# Patient Record
Sex: Female | Born: 1950 | Race: Black or African American | Hispanic: No | Marital: Married | Smoking: Never smoker
Health system: Southern US, Community
[De-identification: ages and names within clinical notes are randomized; demographics above are authoritative.]

## PROBLEM LIST (undated history)

## (undated) DIAGNOSIS — K219 Gastro-esophageal reflux disease without esophagitis: Secondary | ICD-10-CM

## (undated) DIAGNOSIS — T7840XA Allergy, unspecified, initial encounter: Secondary | ICD-10-CM

## (undated) DIAGNOSIS — E785 Hyperlipidemia, unspecified: Secondary | ICD-10-CM

## (undated) DIAGNOSIS — F419 Anxiety disorder, unspecified: Secondary | ICD-10-CM

## (undated) HISTORY — PX: WISDOM TOOTH EXTRACTION: SHX21

## (undated) HISTORY — PX: TONSILLECTOMY: SUR1361

## (undated) HISTORY — PX: COLONOSCOPY: SHX174

## (undated) HISTORY — DX: Hyperlipidemia, unspecified: E78.5

## (undated) HISTORY — PX: DILATION AND CURETTAGE OF UTERUS: SHX78

## (undated) HISTORY — DX: Allergy, unspecified, initial encounter: T78.40XA

## (undated) HISTORY — DX: Anxiety disorder, unspecified: F41.9

## (undated) HISTORY — DX: Gastro-esophageal reflux disease without esophagitis: K21.9

---

## 1999-04-28 ENCOUNTER — Other Ambulatory Visit: Admission: RE | Admit: 1999-04-28 | Discharge: 1999-04-28 | Payer: Self-pay | Admitting: Internal Medicine

## 1999-08-14 ENCOUNTER — Encounter: Admission: RE | Admit: 1999-08-14 | Discharge: 1999-08-14 | Payer: Self-pay | Admitting: Internal Medicine

## 1999-08-14 ENCOUNTER — Encounter: Payer: Self-pay | Admitting: Internal Medicine

## 2000-04-27 ENCOUNTER — Other Ambulatory Visit: Admission: RE | Admit: 2000-04-27 | Discharge: 2000-04-27 | Payer: Self-pay | Admitting: Internal Medicine

## 2000-05-25 ENCOUNTER — Encounter: Admission: RE | Admit: 2000-05-25 | Discharge: 2000-05-25 | Payer: Self-pay | Admitting: Internal Medicine

## 2000-05-25 ENCOUNTER — Encounter: Payer: Self-pay | Admitting: Internal Medicine

## 2001-04-27 ENCOUNTER — Other Ambulatory Visit: Admission: RE | Admit: 2001-04-27 | Discharge: 2001-04-27 | Payer: Self-pay | Admitting: Internal Medicine

## 2001-06-07 ENCOUNTER — Encounter: Admission: RE | Admit: 2001-06-07 | Discharge: 2001-06-07 | Payer: Self-pay | Admitting: Internal Medicine

## 2001-06-07 ENCOUNTER — Encounter: Payer: Self-pay | Admitting: Internal Medicine

## 2002-04-04 ENCOUNTER — Encounter: Admission: RE | Admit: 2002-04-04 | Discharge: 2002-07-03 | Payer: Self-pay | Admitting: Family Medicine

## 2002-06-12 ENCOUNTER — Encounter: Admission: RE | Admit: 2002-06-12 | Discharge: 2002-06-12 | Payer: Self-pay | Admitting: Internal Medicine

## 2002-06-12 ENCOUNTER — Encounter: Payer: Self-pay | Admitting: Internal Medicine

## 2003-06-22 ENCOUNTER — Encounter: Payer: Self-pay | Admitting: Internal Medicine

## 2003-06-22 ENCOUNTER — Encounter: Admission: RE | Admit: 2003-06-22 | Discharge: 2003-06-22 | Payer: Self-pay | Admitting: Internal Medicine

## 2004-05-21 ENCOUNTER — Other Ambulatory Visit: Admission: RE | Admit: 2004-05-21 | Discharge: 2004-05-21 | Payer: Self-pay | Admitting: Internal Medicine

## 2004-08-15 ENCOUNTER — Encounter: Admission: RE | Admit: 2004-08-15 | Discharge: 2004-08-15 | Payer: Self-pay | Admitting: Internal Medicine

## 2005-09-22 ENCOUNTER — Encounter: Admission: RE | Admit: 2005-09-22 | Discharge: 2005-09-22 | Payer: Self-pay | Admitting: Internal Medicine

## 2006-09-29 ENCOUNTER — Encounter: Admission: RE | Admit: 2006-09-29 | Discharge: 2006-09-29 | Payer: Self-pay | Admitting: Internal Medicine

## 2007-05-27 ENCOUNTER — Other Ambulatory Visit: Admission: RE | Admit: 2007-05-27 | Discharge: 2007-05-27 | Payer: Self-pay | Admitting: Internal Medicine

## 2007-10-26 ENCOUNTER — Encounter: Admission: RE | Admit: 2007-10-26 | Discharge: 2007-10-26 | Payer: Self-pay | Admitting: Internal Medicine

## 2008-06-15 ENCOUNTER — Ambulatory Visit: Payer: Self-pay | Admitting: Internal Medicine

## 2008-06-28 ENCOUNTER — Ambulatory Visit: Payer: Self-pay | Admitting: Internal Medicine

## 2008-09-28 ENCOUNTER — Emergency Department (HOSPITAL_COMMUNITY): Admission: EM | Admit: 2008-09-28 | Discharge: 2008-09-28 | Payer: Self-pay | Admitting: Family Medicine

## 2008-10-26 ENCOUNTER — Encounter: Admission: RE | Admit: 2008-10-26 | Discharge: 2008-10-26 | Payer: Self-pay | Admitting: Internal Medicine

## 2009-11-08 ENCOUNTER — Encounter: Admission: RE | Admit: 2009-11-08 | Discharge: 2009-11-08 | Payer: Self-pay | Admitting: Internal Medicine

## 2010-05-06 ENCOUNTER — Other Ambulatory Visit: Admission: RE | Admit: 2010-05-06 | Discharge: 2010-05-06 | Payer: Self-pay | Admitting: Internal Medicine

## 2010-08-05 ENCOUNTER — Encounter: Admission: RE | Admit: 2010-08-05 | Discharge: 2010-08-05 | Payer: Self-pay | Admitting: Internal Medicine

## 2010-10-27 ENCOUNTER — Other Ambulatory Visit: Payer: Self-pay | Admitting: Internal Medicine

## 2010-10-27 DIAGNOSIS — Z1239 Encounter for other screening for malignant neoplasm of breast: Secondary | ICD-10-CM

## 2010-10-27 DIAGNOSIS — Z1231 Encounter for screening mammogram for malignant neoplasm of breast: Secondary | ICD-10-CM

## 2010-11-10 ENCOUNTER — Ambulatory Visit
Admission: RE | Admit: 2010-11-10 | Discharge: 2010-11-10 | Disposition: A | Discharge status: Home / Self Care | Payer: Managed Care, Other (non HMO) | Source: Ambulatory Visit | Attending: Internal Medicine | Admitting: Internal Medicine

## 2010-11-10 DIAGNOSIS — Z1231 Encounter for screening mammogram for malignant neoplasm of breast: Secondary | ICD-10-CM

## 2011-01-12 LAB — URINE CULTURE: Colony Count: 85000

## 2011-01-12 LAB — POCT URINALYSIS DIP (DEVICE)
Bilirubin Urine: NEGATIVE
Ketones, ur: NEGATIVE mg/dL
Protein, ur: 30 mg/dL — AB
Urobilinogen, UA: 0.2 mg/dL (ref 0.0–1.0)
pH: 5 (ref 5.0–8.0)

## 2011-11-02 ENCOUNTER — Other Ambulatory Visit: Payer: Self-pay | Admitting: Internal Medicine

## 2011-11-02 DIAGNOSIS — Z1231 Encounter for screening mammogram for malignant neoplasm of breast: Secondary | ICD-10-CM

## 2011-11-17 ENCOUNTER — Ambulatory Visit
Admission: RE | Admit: 2011-11-17 | Discharge: 2011-11-17 | Disposition: A | Discharge status: Home / Self Care | Payer: BC Managed Care – PPO | Source: Ambulatory Visit | Attending: Internal Medicine | Admitting: Internal Medicine

## 2011-11-17 DIAGNOSIS — Z1231 Encounter for screening mammogram for malignant neoplasm of breast: Secondary | ICD-10-CM

## 2012-11-04 ENCOUNTER — Other Ambulatory Visit: Payer: Self-pay | Admitting: Internal Medicine

## 2012-11-04 DIAGNOSIS — Z1231 Encounter for screening mammogram for malignant neoplasm of breast: Secondary | ICD-10-CM

## 2012-12-01 ENCOUNTER — Ambulatory Visit
Admission: RE | Admit: 2012-12-01 | Discharge: 2012-12-01 | Disposition: A | Discharge status: Home / Self Care | Payer: BC Managed Care – PPO | Source: Ambulatory Visit | Attending: Internal Medicine | Admitting: Internal Medicine

## 2012-12-01 DIAGNOSIS — Z1231 Encounter for screening mammogram for malignant neoplasm of breast: Secondary | ICD-10-CM

## 2013-05-09 ENCOUNTER — Other Ambulatory Visit (HOSPITAL_COMMUNITY)
Admission: RE | Admit: 2013-05-09 | Discharge: 2013-05-09 | Disposition: A | Discharge status: Home / Self Care | Payer: BC Managed Care – PPO | Source: Ambulatory Visit | Attending: Internal Medicine | Admitting: Internal Medicine

## 2013-05-09 DIAGNOSIS — Z01419 Encounter for gynecological examination (general) (routine) without abnormal findings: Secondary | ICD-10-CM | POA: Insufficient documentation

## 2013-09-12 ENCOUNTER — Encounter: Payer: Self-pay | Admitting: Internal Medicine

## 2013-09-12 ENCOUNTER — Telehealth: Payer: Self-pay

## 2013-09-12 NOTE — Telephone Encounter (Signed)
Message copied by Annett Fabian on Tue Sep 12, 2013 11:15 AM ------      Message from: Iva Boop      Created: Tue Sep 12, 2013 10:54 AM      Regarding: age ogf mom when dx CRCA       Please call - ask when mom had CRCA - could change whether she needs one now ------

## 2013-09-12 NOTE — Telephone Encounter (Signed)
Next colonoscopy due 06/2018 then. Routine risk.

## 2013-09-12 NOTE — Telephone Encounter (Signed)
Spoke with the patient.  Her mother was in her 59's when she was diagnosed.

## 2013-09-19 ENCOUNTER — Encounter: Payer: Self-pay | Admitting: Internal Medicine

## 2013-11-21 ENCOUNTER — Other Ambulatory Visit: Payer: Self-pay

## 2013-11-21 DIAGNOSIS — Z1231 Encounter for screening mammogram for malignant neoplasm of breast: Secondary | ICD-10-CM

## 2013-12-13 ENCOUNTER — Ambulatory Visit
Admission: RE | Admit: 2013-12-13 | Discharge: 2013-12-13 | Disposition: A | Discharge status: Home / Self Care | Payer: BC Managed Care – PPO | Source: Ambulatory Visit

## 2013-12-13 DIAGNOSIS — Z1231 Encounter for screening mammogram for malignant neoplasm of breast: Secondary | ICD-10-CM

## 2014-05-09 ENCOUNTER — Ambulatory Visit
Admission: RE | Admit: 2014-05-09 | Discharge: 2014-05-09 | Disposition: A | Discharge status: Home / Self Care | Payer: BC Managed Care – PPO | Source: Ambulatory Visit | Attending: Internal Medicine | Admitting: Internal Medicine

## 2014-05-09 ENCOUNTER — Other Ambulatory Visit: Payer: Self-pay | Admitting: Internal Medicine

## 2014-05-09 DIAGNOSIS — R52 Pain, unspecified: Secondary | ICD-10-CM

## 2014-11-26 ENCOUNTER — Other Ambulatory Visit: Payer: Self-pay

## 2014-11-26 DIAGNOSIS — Z1231 Encounter for screening mammogram for malignant neoplasm of breast: Secondary | ICD-10-CM

## 2014-12-17 ENCOUNTER — Ambulatory Visit
Admission: RE | Admit: 2014-12-17 | Discharge: 2014-12-17 | Disposition: A | Discharge status: Home / Self Care | Payer: BLUE CROSS/BLUE SHIELD | Source: Ambulatory Visit

## 2014-12-17 ENCOUNTER — Encounter (INDEPENDENT_AMBULATORY_CARE_PROVIDER_SITE_OTHER): Payer: Self-pay

## 2014-12-17 DIAGNOSIS — Z1231 Encounter for screening mammogram for malignant neoplasm of breast: Secondary | ICD-10-CM

## 2015-04-04 ENCOUNTER — Encounter: Payer: Self-pay | Admitting: Internal Medicine

## 2015-08-26 DIAGNOSIS — L669 Cicatricial alopecia, unspecified: Secondary | ICD-10-CM | POA: Insufficient documentation

## 2015-08-26 DIAGNOSIS — L6681 Central centrifugal cicatricial alopecia: Secondary | ICD-10-CM | POA: Insufficient documentation

## 2015-11-27 ENCOUNTER — Other Ambulatory Visit: Payer: Self-pay

## 2015-11-27 DIAGNOSIS — Z1231 Encounter for screening mammogram for malignant neoplasm of breast: Secondary | ICD-10-CM

## 2015-12-18 ENCOUNTER — Ambulatory Visit
Admission: RE | Admit: 2015-12-18 | Discharge: 2015-12-18 | Disposition: A | Discharge status: Home / Self Care | Payer: BLUE CROSS/BLUE SHIELD | Source: Ambulatory Visit

## 2015-12-18 DIAGNOSIS — Z1231 Encounter for screening mammogram for malignant neoplasm of breast: Secondary | ICD-10-CM

## 2016-05-27 ENCOUNTER — Other Ambulatory Visit (HOSPITAL_COMMUNITY)
Admission: RE | Admit: 2016-05-27 | Discharge: 2016-05-27 | Disposition: A | Discharge status: Home / Self Care | Payer: BLUE CROSS/BLUE SHIELD | Source: Ambulatory Visit | Attending: Internal Medicine | Admitting: Internal Medicine

## 2016-05-27 DIAGNOSIS — Z01419 Encounter for gynecological examination (general) (routine) without abnormal findings: Secondary | ICD-10-CM | POA: Diagnosis present

## 2016-11-25 ENCOUNTER — Other Ambulatory Visit: Payer: Self-pay | Admitting: Internal Medicine

## 2016-11-25 DIAGNOSIS — Z1231 Encounter for screening mammogram for malignant neoplasm of breast: Secondary | ICD-10-CM

## 2017-01-04 ENCOUNTER — Ambulatory Visit
Admission: RE | Admit: 2017-01-04 | Discharge: 2017-01-04 | Disposition: A | Discharge status: Home / Self Care | Payer: Medicare Other | Source: Ambulatory Visit | Attending: Internal Medicine | Admitting: Internal Medicine

## 2017-01-04 DIAGNOSIS — Z1231 Encounter for screening mammogram for malignant neoplasm of breast: Secondary | ICD-10-CM

## 2017-12-10 ENCOUNTER — Other Ambulatory Visit: Payer: Self-pay | Admitting: Internal Medicine

## 2017-12-10 DIAGNOSIS — Z1231 Encounter for screening mammogram for malignant neoplasm of breast: Secondary | ICD-10-CM

## 2018-01-05 ENCOUNTER — Ambulatory Visit: Payer: Medicare Other

## 2018-01-05 ENCOUNTER — Ambulatory Visit
Admission: RE | Admit: 2018-01-05 | Discharge: 2018-01-05 | Disposition: A | Discharge status: Home / Self Care | Payer: Medicare Other | Source: Ambulatory Visit | Attending: Internal Medicine | Admitting: Internal Medicine

## 2018-01-05 DIAGNOSIS — Z1231 Encounter for screening mammogram for malignant neoplasm of breast: Secondary | ICD-10-CM

## 2018-07-29 ENCOUNTER — Encounter: Payer: Self-pay | Admitting: Internal Medicine

## 2018-08-22 ENCOUNTER — Encounter: Payer: Self-pay | Admitting: Podiatry

## 2018-08-22 ENCOUNTER — Ambulatory Visit: Payer: Medicare Other | Admitting: Podiatry

## 2018-08-22 VITALS — BP 137/69 | HR 72 | Resp 16

## 2018-08-22 DIAGNOSIS — Q828 Other specified congenital malformations of skin: Secondary | ICD-10-CM

## 2018-08-22 NOTE — Progress Notes (Signed)
   Subjective:    Patient ID: Karla Moore, female    DOB: 1950-10-17, 67 y.o.   MRN: [id number]  HPI    Review of Systems  All other systems reviewed and are negative.      Objective:   Physical Exam        Assessment & Plan:

## 2018-08-23 NOTE — Progress Notes (Signed)
Subjective:   Patient ID: Karla Moore, female   DOB: 67 y.o.   MRN: [id number]   HPI Patient presents with lesions on the bottom of both feet that is been sore and is been going on for a while and had it treated a number of years ago.  Patient tries to treated self but at times they become too tender and she cannot do it.  Patient does not smoke and likes to be active   ROS      Objective:  Physical Exam  Constitutional: She appears well-developed and well-nourished.  Cardiovascular: Intact distal pulses.  Pulmonary/Chest: Effort normal.  Musculoskeletal: Normal range of motion.  Neurological: She is alert.  Skin: Skin is warm.  Nursing note and vitals reviewed.   Neurovascular status intact muscle strength was adequate patient found to have keratotic lesion sub-fifth metatarsal right sub-second metatarsal left with a small plugged gland that is very painful when palpated.  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  Educated her on deformity

## 2018-08-30 ENCOUNTER — Encounter: Payer: Self-pay | Admitting: Internal Medicine

## 2018-09-14 ENCOUNTER — Encounter: Payer: Medicare Other | Admitting: Internal Medicine

## 2018-09-29 ENCOUNTER — Encounter: Payer: Self-pay | Admitting: Internal Medicine

## 2018-09-29 ENCOUNTER — Ambulatory Visit (AMBULATORY_SURGERY_CENTER): Payer: Self-pay | Admitting: *Deleted

## 2018-09-29 VITALS — Ht 67.0 in | Wt 180.0 lb

## 2018-09-29 DIAGNOSIS — Z1211 Encounter for screening for malignant neoplasm of colon: Secondary | ICD-10-CM

## 2018-09-29 NOTE — Progress Notes (Signed)
No egg or soy allergy known to patient  No issues with past sedation with any surgeries  or procedures, no intubation problems  No diet pills per patient No home 02 use per patient  No blood thinners per patient  Pt denies issues with constipation  No A fib or A flutter  EMMI video sent to pt's e mail -- pt declined  husband in [place] today with pt

## 2018-10-13 ENCOUNTER — Ambulatory Visit: Payer: Medicare Other | Admitting: Internal Medicine

## 2018-10-13 ENCOUNTER — Encounter: Payer: Self-pay | Admitting: Internal Medicine

## 2018-10-13 VITALS — BP 147/75 | HR 81 | Temp 98.0°F | Ht 67.0 in | Wt 180.0 lb

## 2018-10-13 MED ORDER — SODIUM CHLORIDE 0.9 % IV SOLN: 500.0000 mL | Freq: Once | INTRAVENOUS | Status: DC

## 2018-10-13 NOTE — Progress Notes (Signed)
Unable to obtain IV access. Patient offered unsedated colonoscopy but declined. Will need to arrange for IV placement by interventional radiology and colonoscopy (all in one day) at hospital.

## 2018-10-17 ENCOUNTER — Telehealth: Payer: Self-pay

## 2018-10-17 DIAGNOSIS — Z1211 Encounter for screening for malignant neoplasm of colon: Secondary | ICD-10-CM

## 2018-10-17 NOTE — Telephone Encounter (Signed)
Left message for patient to call back  

## 2018-10-18 NOTE — Telephone Encounter (Signed)
Karla Mayer, MD at 10/13/2018 2:00 PM   Status: Signed    Unable to obtain IV access. Patient offered unsedated colonoscopy but declined. Will need to arrange for IV placement by interventional radiology and colonoscopy (all in one day) at hospital.       Left message for patient to call back

## 2018-10-21 ENCOUNTER — Telehealth: Payer: Self-pay | Admitting: Internal Medicine

## 2018-10-21 NOTE — Telephone Encounter (Signed)
Pt returned your call to schedule at the hospital.

## 2018-10-24 ENCOUNTER — Other Ambulatory Visit: Payer: Self-pay

## 2018-10-24 DIAGNOSIS — Z1211 Encounter for screening for malignant neoplasm of colon: Secondary | ICD-10-CM

## 2018-10-24 NOTE — Addendum Note (Signed)
Addended by: Marlon Pel on: 10/24/2018 09:40 AM   Modules accepted: Orders

## 2018-10-24 NOTE — Telephone Encounter (Signed)
Patient is scheduled for IR IV placement on 12/13/18 8:00, colonoscopy on 12/13/18 9:30.    Left message for patient to call back

## 2018-10-24 NOTE — Telephone Encounter (Signed)
Patient notified of appt dates and times. She is not able to come for colonoscopy on 12/13/18.  She understands to call me in April to check on a date for April or May.

## 2018-10-24 NOTE — Telephone Encounter (Signed)
See alternate phone note for details.  

## 2018-12-01 ENCOUNTER — Other Ambulatory Visit: Payer: Self-pay | Admitting: Internal Medicine

## 2018-12-01 DIAGNOSIS — Z1231 Encounter for screening mammogram for malignant neoplasm of breast: Secondary | ICD-10-CM

## 2018-12-13 ENCOUNTER — Encounter (HOSPITAL_COMMUNITY): Payer: Self-pay

## 2018-12-13 ENCOUNTER — Ambulatory Visit (HOSPITAL_COMMUNITY): Admit: 2018-12-13 | Payer: Medicare Other | Admitting: Internal Medicine

## 2018-12-13 ENCOUNTER — Other Ambulatory Visit (HOSPITAL_COMMUNITY): Payer: Medicare Other

## 2018-12-13 SURGERY — COLONOSCOPY WITH PROPOFOL
Anesthesia: Monitor Anesthesia Care

## 2019-01-09 ENCOUNTER — Ambulatory Visit: Payer: Medicare Other

## 2019-02-08 ENCOUNTER — Telehealth: Payer: Self-pay | Admitting: Internal Medicine

## 2019-02-08 NOTE — Telephone Encounter (Signed)
See previous phone notes.  Patient is a a screening procedure.  She is advised due to Covid-19 we are just starting to reschedule our non- urgent cases, but have not been authorized to reschedule screenings yet.  She is advised that I will call her once we begin to reschedule.  Will most likely be July.

## 2019-02-08 NOTE — Telephone Encounter (Signed)
Pt would like to reschedule colonoscopy at hospital.

## 2019-02-27 ENCOUNTER — Ambulatory Visit: Payer: Medicare Other

## 2019-04-06 ENCOUNTER — Ambulatory Visit
Admission: RE | Admit: 2019-04-06 | Discharge: 2019-04-06 | Disposition: A | Discharge status: Home / Self Care | Payer: Medicare Other | Source: Ambulatory Visit | Attending: Internal Medicine | Admitting: Internal Medicine

## 2019-04-06 DIAGNOSIS — Z1231 Encounter for screening mammogram for malignant neoplasm of breast: Secondary | ICD-10-CM

## 2019-04-07 ENCOUNTER — Other Ambulatory Visit: Payer: Self-pay | Admitting: Internal Medicine

## 2019-04-07 DIAGNOSIS — N644 Mastodynia: Secondary | ICD-10-CM

## 2019-04-13 ENCOUNTER — Ambulatory Visit
Admission: RE | Admit: 2019-04-13 | Discharge: 2019-04-13 | Disposition: A | Discharge status: Home / Self Care | Payer: Medicare Other | Source: Ambulatory Visit | Attending: Internal Medicine | Admitting: Internal Medicine

## 2019-04-13 ENCOUNTER — Other Ambulatory Visit: Payer: Self-pay

## 2019-04-13 DIAGNOSIS — N644 Mastodynia: Secondary | ICD-10-CM

## 2019-06-21 NOTE — Telephone Encounter (Signed)
Pt called states would like to check status on scheduling colonoscopy at [facility].

## 2019-06-23 NOTE — Telephone Encounter (Signed)
Left message on machine to call back  

## 2019-07-04 ENCOUNTER — Other Ambulatory Visit: Payer: Self-pay

## 2019-07-04 DIAGNOSIS — Z1211 Encounter for screening for malignant neoplasm of colon: Secondary | ICD-10-CM

## 2019-07-04 NOTE — Addendum Note (Signed)
Addended by: Marlon Pel on: 07/04/2019 03:50 PM   Modules accepted: Orders

## 2019-07-04 NOTE — Telephone Encounter (Signed)
Patient would like to be scheduled for Nov date.  She is aware I will call her when the Nov schedule comes out.

## 2019-07-04 NOTE — Telephone Encounter (Signed)
Patient has been scheduled for 07/31/19.  I mailed her her instructions.  She understands to call if she has questions.

## 2019-07-27 ENCOUNTER — Encounter (HOSPITAL_COMMUNITY): Payer: Self-pay | Admitting: *Deleted

## 2019-07-27 NOTE — Progress Notes (Signed)
Preop instructions for: Karla Moore                     Date of Birth         Oct 16, 1950                   Date of Procedure: 07/31/2019      Doctor:  Dr Carlean Purl Time to arrive at [facility]: 0600 Report to: Admitting  Procedure: colonscopy  Do not eat or drink past midnight the night before your procedure. Take these morning medications only with sips of water. losartan  Note: No Insulin or Diabetic meds should be given or taken the morning of the procedure!     Bring Insurance card and picture ID Leave all jewelry and other valuables at place where living( no metal or rings to be worn) No contact lens Women-no make-up, no lotions,perfumes,powders Men-no colognes,lotions  Any questions day of procedure,call  Endoscopy  [phone]

## 2019-07-28 ENCOUNTER — Other Ambulatory Visit (HOSPITAL_COMMUNITY)
Admission: RE | Admit: 2019-07-28 | Discharge: 2019-07-28 | Disposition: A | Discharge status: Home / Self Care | Payer: Medicare Other | Source: Ambulatory Visit | Attending: Internal Medicine | Admitting: Internal Medicine

## 2019-07-28 DIAGNOSIS — Z01812 Encounter for preprocedural laboratory examination: Secondary | ICD-10-CM | POA: Insufficient documentation

## 2019-07-28 DIAGNOSIS — Z20828 Contact with and (suspected) exposure to other viral communicable diseases: Secondary | ICD-10-CM | POA: Diagnosis not present

## 2019-07-29 LAB — NOVEL CORONAVIRUS, NAA (HOSP ORDER, SEND-OUT TO REF LAB; TAT 18-24 HRS): SARS-CoV-2, NAA: NOT DETECTED

## 2019-07-29 NOTE — Anesthesia Preprocedure Evaluation (Addendum)
Anesthesia Evaluation  Patient identified by MRN, date of birth, ID band Patient awake    Reviewed: Allergy & Precautions, NPO status , Patient's Chart, lab work & pertinent test results  History of Anesthesia Complications Negative for: history of anesthetic complications  Airway Mallampati: II  TM Distance: >3 FB Neck ROM: Full    Dental  (+) Upper Dentures, Partial Lower   Pulmonary neg pulmonary ROS,    Pulmonary exam normal        Cardiovascular hypertension, Pt. on medications Normal cardiovascular exam     Neuro/Psych Anxiety negative neurological ROS     GI/Hepatic Neg liver ROS, GERD  Controlled and Medicated,  Endo/Other  negative endocrine ROS  Renal/GU negative Renal ROS  negative genitourinary   Musculoskeletal negative musculoskeletal ROS (+)   Abdominal   Peds  Hematology negative hematology ROS (+)   Anesthesia Other Findings Day of surgery medications reviewed with patient.  Reproductive/Obstetrics negative OB ROS                            Anesthesia Physical Anesthesia Plan  ASA: II  Anesthesia Plan: MAC   Post-op Pain Management:    Induction:   PONV Risk Score and Plan: Treatment may vary due to age or medical condition and Propofol infusion  Airway Management Planned: Natural Airway and Simple Face Mask  Additional Equipment: None  Intra-op Plan:   Post-operative Plan:   Informed Consent: I have reviewed the patients History and Physical, chart, labs and discussed the procedure including the risks, benefits and alternatives for the proposed anesthesia with the patient or authorized representative who has indicated his/her understanding and acceptance.       Plan Discussed with: CRNA  Anesthesia Plan Comments:        Anesthesia Quick Evaluation

## 2019-07-31 ENCOUNTER — Encounter (HOSPITAL_COMMUNITY)
Admission: RE | Disposition: A | Discharge status: Home / Self Care | Payer: Medicare Other | Source: Home / Self Care | Attending: Internal Medicine

## 2019-07-31 ENCOUNTER — Other Ambulatory Visit (HOSPITAL_COMMUNITY): Payer: Medicare Other

## 2019-07-31 ENCOUNTER — Ambulatory Visit (HOSPITAL_COMMUNITY): Payer: Medicare Other | Admitting: Anesthesiology

## 2019-07-31 ENCOUNTER — Other Ambulatory Visit: Payer: Self-pay

## 2019-07-31 ENCOUNTER — Encounter (HOSPITAL_COMMUNITY): Payer: Self-pay | Admitting: Anesthesiology

## 2019-07-31 ENCOUNTER — Ambulatory Visit (HOSPITAL_COMMUNITY)
Admission: RE | Admit: 2019-07-31 | Discharge: 2019-07-31 | Disposition: A | Discharge status: Home / Self Care | Payer: Medicare Other | Attending: Internal Medicine | Admitting: Internal Medicine

## 2019-07-31 DIAGNOSIS — Z79899 Other long term (current) drug therapy: Secondary | ICD-10-CM | POA: Diagnosis not present

## 2019-07-31 DIAGNOSIS — K219 Gastro-esophageal reflux disease without esophagitis: Secondary | ICD-10-CM | POA: Diagnosis not present

## 2019-07-31 DIAGNOSIS — E785 Hyperlipidemia, unspecified: Secondary | ICD-10-CM | POA: Diagnosis not present

## 2019-07-31 DIAGNOSIS — D123 Benign neoplasm of transverse colon: Secondary | ICD-10-CM | POA: Diagnosis not present

## 2019-07-31 DIAGNOSIS — Z1211 Encounter for screening for malignant neoplasm of colon: Secondary | ICD-10-CM | POA: Insufficient documentation

## 2019-07-31 DIAGNOSIS — F419 Anxiety disorder, unspecified: Secondary | ICD-10-CM | POA: Insufficient documentation

## 2019-07-31 DIAGNOSIS — Z886 Allergy status to analgesic agent status: Secondary | ICD-10-CM | POA: Insufficient documentation

## 2019-07-31 HISTORY — PX: COLONOSCOPY WITH PROPOFOL: SHX5780

## 2019-07-31 HISTORY — PX: POLYPECTOMY: SHX5525

## 2019-07-31 SURGERY — COLONOSCOPY WITH PROPOFOL
Anesthesia: Monitor Anesthesia Care

## 2019-07-31 MED ORDER — PROPOFOL 10 MG/ML IV BOLUS
INTRAVENOUS | Status: DC | PRN
  Administered 2019-07-31 (×5): 20 mg via INTRAVENOUS

## 2019-07-31 MED ORDER — SODIUM CHLORIDE 0.9 % IV SOLN: INTRAVENOUS | Status: DC

## 2019-07-31 MED ORDER — PROPOFOL 500 MG/50ML IV EMUL
INTRAVENOUS | Status: DC | PRN
  Administered 2019-07-31: 125 ug/kg/min via INTRAVENOUS

## 2019-07-31 MED ORDER — LACTATED RINGERS IV SOLN
INTRAVENOUS | Status: DC
  Administered 2019-07-31: 1000 mL via INTRAVENOUS

## 2019-07-31 MED ORDER — PROPOFOL 10 MG/ML IV BOLUS
INTRAVENOUS | Status: AC
  Filled 2019-07-31: qty 60

## 2019-07-31 MED ORDER — LIDOCAINE 2% (20 MG/ML) 5 ML SYRINGE
INTRAMUSCULAR | Status: DC | PRN
  Administered 2019-07-31: 100 mg via INTRAVENOUS

## 2019-07-31 SURGICAL SUPPLY — 22 items

## 2019-07-31 NOTE — H&P (Signed)
[facility] History and Physical   Primary Care Physician:  Levin Erp, MD   Reason for Procedure:   colon cancer screening  Plan:    colonoscopy     HPI: Loette Karla Moore is a 68 y.o. female here for screening colonoscopy   Past Medical History:  Diagnosis Date  . Allergy    spring   . Anxiety   . GERD (gastroesophageal reflux disease)   . Hyperlipidemia     Past Surgical History:  Procedure Laterality Date  . COLONOSCOPY    . DILATION AND CURETTAGE OF UTERUS     x 2 per pt  . TONSILLECTOMY    . WISDOM TOOTH EXTRACTION      Prior to Admission medications   Medication Sig Start Date End Date Taking? Authorizing Provider  acetaminophen (TYLENOL) 500 MG tablet Take 500 mg by mouth every 6 (six) hours as needed (Cold).    Yes [provider]  ALPRAZolam (XANAX) 0.25 MG tablet Take 0.25 mg by mouth as needed for anxiety.  06/28/18  Yes [provider]  cholecalciferol (VITAMIN D) 1000 units tablet Take 2,000 Units by mouth daily.   Yes [provider]  ECHINACEA PO Take 900 mg by mouth as needed (Cold).    Yes [provider]  losartan (COZAAR) 25 MG tablet Take 25 mg by mouth every evening.  07/20/19  Yes [provider]  Magnesium 250 MG TABS Take 500 mg by mouth daily. Do not take on the weekends   Yes [provider]  Multiple Vitamin (MULTIVITAMIN) tablet Take 1 tablet by mouth daily. Do no take on the Weekend   Yes [provider]  omeprazole (PRILOSEC) 20 MG capsule Take 20 mg by mouth every other day. 07/04/19  Yes [provider]  rosuvastatin (CRESTOR) 5 MG tablet Take 2.5 mg by mouth every evening.  06/28/18  Yes [provider]  vitamin E 200 UNIT capsule Take 200 Units by mouth daily. Do not take on Weekends   Yes [provider]    Current Facility-Administered Medications  Medication Dose Route Frequency Provider Last Rate Last Dose  . 0.9 %  sodium  chloride infusion   Intravenous Continuous Gatha Mayer, MD      . lactated ringers infusion   Intravenous Continuous Gatha Mayer, MD 10 mL/hr at 07/31/19 0711 1,000 mL at 07/31/19 0711    Allergies as of 07/04/2019 - Review Complete 10/13/2018  Allergen Reaction Noted  . Ibuprofen Other (See Comments) 03/12/2015    Family History  Problem Relation Age of Onset  . Colon cancer Mother 37       80's  . Lung cancer Brother   . Lung cancer Brother   . Breast cancer Neg Hx   . Colon polyps Neg Hx   . Esophageal cancer Neg Hx   . Rectal cancer Neg Hx   . Stomach cancer Neg Hx     Social History   Socioeconomic History  . Marital status: Married    Spouse name: Not on file  . Number of children: Not on file  . Years of education: Not on file  . Highest education level: Not on file  Occupational History  . Not on file  Social Needs  . Financial resource strain: Not on file  . Food insecurity    Worry: Not on file    Inability: Not on file  . Transportation needs    Medical: Not on file  Non-medical: Not on file  Tobacco Use  . Smoking status: Never Smoker  . Smokeless tobacco: Never Used  Substance and Sexual Activity  . Alcohol use: Yes    Comment: socially   . Drug use: Never  . Sexual activity: Not on file  Lifestyle  . Physical activity    Days per week: Not on file    Minutes per session: Not on file  . Stress: Not on file  Relationships  . Social Herbalist on phone: Not on file    Gets together: Not on file    Attends religious service: Not on file    Active member of club or organization: Not on file    Attends meetings of clubs or organizations: Not on file    Relationship status: Not on file  . Intimate partner violence    Fear of current or ex partner: Not on file    Emotionally abused: Not on file    Physically abused: Not on file    Forced sexual activity: Not on file  Other Topics Concern  . Not on file  Social History  Narrative  . Not on file    Review of Systems:  All other review of systems negative except as mentioned in the HPI.  Physical Exam: Vital signs in last 24 hours: Temp:  [97.6 F (36.4 C)] 97.6 F (36.4 C) (11/02 0626) Pulse Rate:  [71] 71 (11/02 0626) Resp:  [20] 20 (11/02 0626) BP: (151)/(74) 151/74 (11/02 0626) SpO2:  [100 %] 100 % (11/02 0626) Weight:  [83.9 kg] 83.9 kg (11/02 0626)   General:   Alert,  Well-developed, well-nourished, pleasant and cooperative in NAD Lungs:  Clear throughout to auscultation.   Heart:  Regular rate and rhythm; no murmurs, clicks, rubs,  or gallops. Abdomen:  Soft, nontender and nondistended. Normal bowel sounds.   Neuro/Psych:  Alert and cooperative. Normal mood and affect. A and O x 3   @Carl  Simonne Maffucci, MD, [facility] [phone] (pager) 07/31/2019 7:38 AM@

## 2019-07-31 NOTE — Op Note (Signed)
[facility] Patient Name: Karla Moore Procedure Date: 07/31/2019 MRN: [id number] Attending MD: Gatha Mayer , MD Date of Birth: Jun 16, 1951 CSN: [id number] Age: 68 Admit Type: Outpatient Procedure:                Colonoscopy Indications:              Screening for colorectal malignant neoplasm, Last                            colonoscopy: 2009 Providers:                Gatha Mayer, MD, Cleda Daub, RN, William Dalton, Technician Referring MD:             Lance Muss Medicines:                Propofol per Anesthesia, Monitored Anesthesia Care Complications:            No immediate complications. Estimated Blood Loss:     Estimated blood loss was minimal. Procedure:                Pre-Anesthesia Assessment:                           - Prior to the procedure, a History and Physical                            was performed, and patient medications and                            allergies were reviewed. The patient's tolerance of                            previous anesthesia was also reviewed. The risks                            and benefits of the procedure and the sedation                            options and risks were discussed with the patient.                            All questions were answered, and informed consent                            was obtained. Prior Anticoagulants: The patient has                            taken no previous anticoagulant or antiplatelet                            agents. ASA Grade Assessment: II - A patient with  mild systemic disease. After reviewing the risks                            and benefits, the patient was deemed in                            satisfactory condition to undergo the procedure.                           After obtaining informed consent, the colonoscope                            was passed under direct vision. Throughout the               procedure, the patient's blood pressure, pulse, and                            oxygen saturations were monitored continuously. The                            [id number] Olympus colonoscope was                            introduced through the anus and advanced to the the                            cecum, identified by appendiceal orifice and                            ileocecal valve. The colonoscopy was performed                            without difficulty. The patient tolerated the                            procedure well. The quality of the bowel                            preparation was excellent. The ileocecal valve,                            appendiceal orifice, and rectum were photographed. Scope In: 7:46:55 AM Scope Out: 8:01:15 AM Scope Withdrawal Time: 0 hours 6 minutes 0 seconds  Total Procedure Duration: 0 hours 14 minutes 20 seconds  Findings:      The perianal and digital rectal examinations were normal.      A diminutive polyp was found in the distal transverse colon. The polyp       was sessile. The polyp was removed with a cold snare. Resection and       retrieval were complete. Verification of patient identification for the       specimen was done. Estimated blood loss was minimal.      The exam was otherwise without abnormality on direct and retroflexion       views. Impression:               -  One diminutive polyp in the distal transverse                            colon, removed with a cold snare. Resected and                            retrieved.                           - The examination was otherwise normal on direct                            and retroflexion views. Moderate Sedation:      Not Applicable - Patient had care per Anesthesia. Recommendation:           - Patient has a contact number available for                            emergencies. The signs and symptoms of potential                            delayed complications  were discussed with the                            patient. Return to normal activities tomorrow.                            Written discharge instructions were provided to the                            patient.                           - Resume previous diet.                           - Continue present medications.                           - Repeat colonoscopy may be recommended. The                            colonoscopy date will be determined after pathology                            results from today's exam become available for                            review.                           - she had IV access issues 09/2018 - CRNA was able                            to do IV today Procedure Code(s):        --- Professional ---  45385, Colonoscopy, flexible; with removal of                            tumor(s), polyp(s), or other lesion(s) by snare                            technique Diagnosis Code(s):        --- Professional ---                           Z12.11, Encounter for screening for malignant                            neoplasm of colon                           K63.5, Polyp of colon CPT copyright 2019 American [employer] Association. All rights reserved. The codes documented in this report are preliminary and upon coder review may  be revised to meet current compliance requirements. Gatha Mayer, MD 07/31/2019 8:08:25 AM This report has been signed electronically. Number of Addenda: 0

## 2019-07-31 NOTE — Transfer of Care (Signed)
Immediate Anesthesia Transfer of Care Note  Patient: Karla Moore  Procedure(s) Performed: COLONOSCOPY WITH PROPOFOL (N/A ) POLYPECTOMY  Patient Location: Endoscopy Unit  Anesthesia Type:MAC  Level of Consciousness: awake and alert   Airway & Oxygen Therapy: Patient Spontanous Breathing and Patient connected to face mask oxygen  Post-op Assessment: Report given to RN and Post -op Vital signs reviewed and stable  Post vital signs: Reviewed and stable  Last Vitals:  Vitals Value Taken Time  BP    Temp    Pulse    Resp 13 07/31/19 0807  SpO2    Vitals shown include unvalidated device data.  Last Pain:  Vitals:   07/31/19 0626  TempSrc: Oral  PainSc: 0-No pain         Complications: No apparent anesthesia complications

## 2019-07-31 NOTE — Anesthesia Postprocedure Evaluation (Signed)
Anesthesia Post Note  Patient: Malyah Ohlrich Kaine  Procedure(s) Performed: COLONOSCOPY WITH PROPOFOL (N/A ) POLYPECTOMY     Patient location during evaluation: PACU Anesthesia Type: MAC Level of consciousness: awake and alert and oriented Pain management: pain level controlled Vital Signs Assessment: post-procedure vital signs reviewed and stable Respiratory status: spontaneous breathing, nonlabored ventilation and respiratory function stable Cardiovascular status: blood pressure returned to baseline Postop Assessment: no apparent nausea or vomiting Anesthetic complications: no    Last Vitals:  Vitals:   07/31/19 0814 07/31/19 0820  BP: 114/90 (!) 150/74  Pulse:    Resp: (!) 21 19  Temp:    SpO2: 99% 99%    Last Pain:  Vitals:   07/31/19 0811  TempSrc: Oral  PainSc: 0-No pain                 Brennan Bailey

## 2019-07-31 NOTE — Anesthesia Procedure Notes (Signed)
Date/Time: 07/31/2019 7:43 AM Performed by: Sharlette Dense, CRNA Oxygen Delivery Method: Simple face mask

## 2019-07-31 NOTE — Discharge Instructions (Signed)
° °  I found and removed just one tiny polyp.  I will let you know pathology results and when to have another routine colonoscopy by mail and/or My Chart.  I appreciate the opportunity to care for you. Gatha Mayer, MD, FACG   YOU HAD AN ENDOSCOPIC PROCEDURE TODAY: Refer to the procedure report and other information in the discharge instructions given to you for any specific questions about what was found during the examination. If this information does not answer your questions, please call Dr. Celesta Aver office at [phone] to clarify.   YOU SHOULD EXPECT: Some feelings of bloating in the abdomen. Passage of more gas than usual. Walking can help get rid of the air that was put into your GI tract during the procedure and reduce the bloating. If you had a lower endoscopy (such as a colonoscopy or flexible sigmoidoscopy) you may notice spotting of blood in your stool or on the toilet paper. Some abdominal soreness may be present for a day or two, also.  DIET: Your first meal following the procedure should be a light meal and then it is ok to progress to your normal diet. A half-sandwich or bowl of soup is an example of a good first meal. Heavy or fried foods are harder to digest and may make you feel nauseous or bloated. Drink plenty of fluids but you should avoid alcoholic beverages for 24 hours.   ACTIVITY: Your care partner should take you home directly after the procedure. You should plan to take it easy, moving slowly for the rest of the day. You can resume normal activity the day after the procedure however YOU SHOULD NOT DRIVE, use power tools, machinery or perform tasks that involve climbing or major physical exertion for 24 hours (because of the sedation medicines used during the test).   SYMPTOMS TO REPORT IMMEDIATELY: A gastroenterologist can be reached at any hour. Please call [phone]  for any of the following symptoms:  Following lower endoscopy (colonoscopy, flexible  sigmoidoscopy) Excessive amounts of blood in the stool  Significant tenderness, worsening of abdominal pains  Swelling of the abdomen that is new, acute  Fever of 100 or higher

## 2019-08-01 LAB — SURGICAL PATHOLOGY

## 2019-08-02 ENCOUNTER — Encounter (HOSPITAL_COMMUNITY): Payer: Self-pay | Admitting: Internal Medicine

## 2019-08-03 ENCOUNTER — Encounter: Payer: Self-pay | Admitting: Internal Medicine

## 2019-08-03 DIAGNOSIS — Z86010 Personal history of colonic polyps: Secondary | ICD-10-CM

## 2019-08-03 DIAGNOSIS — Z860101 Personal history of adenomatous and serrated colon polyps: Secondary | ICD-10-CM

## 2019-08-03 HISTORY — DX: Personal history of colonic polyps: Z86.010

## 2019-08-03 HISTORY — DX: Personal history of adenomatous and serrated colon polyps: Z86.0101

## 2019-08-03 NOTE — Progress Notes (Signed)
Diminutive adenoma - 2027 recall

## 2019-12-04 ENCOUNTER — Other Ambulatory Visit: Payer: Self-pay | Admitting: Internal Medicine

## 2019-12-04 DIAGNOSIS — Z1231 Encounter for screening mammogram for malignant neoplasm of breast: Secondary | ICD-10-CM

## 2019-12-04 DIAGNOSIS — Z13820 Encounter for screening for osteoporosis: Secondary | ICD-10-CM

## 2020-04-15 ENCOUNTER — Ambulatory Visit
Admission: RE | Admit: 2020-04-15 | Discharge: 2020-04-15 | Disposition: A | Discharge status: Home / Self Care | Payer: Medicare Other | Source: Ambulatory Visit | Attending: Internal Medicine | Admitting: Internal Medicine

## 2020-04-15 ENCOUNTER — Other Ambulatory Visit: Payer: Self-pay

## 2020-04-15 DIAGNOSIS — Z13820 Encounter for screening for osteoporosis: Secondary | ICD-10-CM

## 2020-04-15 DIAGNOSIS — Z1231 Encounter for screening mammogram for malignant neoplasm of breast: Secondary | ICD-10-CM

## 2020-07-02 ENCOUNTER — Ambulatory Visit: Payer: Medicare Other

## 2020-07-09 ENCOUNTER — Ambulatory Visit: Payer: Medicare Other | Attending: Internal Medicine

## 2020-07-09 DIAGNOSIS — Z23 Encounter for immunization: Secondary | ICD-10-CM

## 2020-07-09 NOTE — Progress Notes (Signed)
   Covid-19 Vaccination Clinic  Name:  Karla Moore    MRN: [id number] DOB: August 05, 1951  07/09/2020  Karla Moore was observed post Covid-19 immunization for 15 minutes without incident. She was provided with Vaccine Information Sheet and instruction to access the V-Safe system.   Karla Moore was instructed to call 911 with any severe reactions post vaccine: Marland Kitchen Difficulty breathing  . Swelling of face and throat  . A fast heartbeat  . A bad rash all over body  . Dizziness and weakness

## 2020-08-12 ENCOUNTER — Ambulatory Visit: Payer: Self-pay

## 2020-08-12 ENCOUNTER — Encounter: Payer: Self-pay | Admitting: Orthopaedic Surgery

## 2020-08-12 ENCOUNTER — Ambulatory Visit (INDEPENDENT_AMBULATORY_CARE_PROVIDER_SITE_OTHER): Payer: Medicare Other | Admitting: Orthopaedic Surgery

## 2020-08-12 ENCOUNTER — Other Ambulatory Visit: Payer: Self-pay

## 2020-08-12 VITALS — Ht 67.0 in | Wt 181.0 lb

## 2020-08-12 DIAGNOSIS — M25561 Pain in right knee: Secondary | ICD-10-CM | POA: Diagnosis not present

## 2020-08-12 DIAGNOSIS — M25572 Pain in left ankle and joints of left foot: Secondary | ICD-10-CM | POA: Diagnosis not present

## 2020-08-12 DIAGNOSIS — S93402A Sprain of unspecified ligament of left ankle, initial encounter: Secondary | ICD-10-CM | POA: Diagnosis not present

## 2020-08-12 MED ORDER — LIDOCAINE HCL 1 % IJ SOLN
3.0000 mL | INTRAMUSCULAR | Status: AC | PRN
  Administered 2020-08-12: 3 mL

## 2020-08-12 MED ORDER — MELOXICAM 15 MG PO TABS: 15.0000 mg | ORAL_TABLET | Freq: Every day | ORAL | 0 refills | Status: DC

## 2020-08-12 MED ORDER — METHYLPREDNISOLONE ACETATE 40 MG/ML IJ SUSP
40.0000 mg | INTRAMUSCULAR | Status: AC | PRN
  Administered 2020-08-12: 40 mg via INTRA_ARTICULAR

## 2020-08-12 NOTE — Progress Notes (Signed)
Office Visit Note   Patient: Karla Moore           Date of Birth: [birth year]           MRN: [id number] Visit Date: 08/12/2020              Requested by: Michael Boston, MD [address] [place],  [place] [address] PCP: Michael Boston, MD   Assessment & Plan: Visit Diagnoses:  1. Acute pain of right knee   2. Pain in left ankle and joints of left foot   3. Severe sprain of left ankle, initial encounter     Plan: At this point I would like to try a steroid injection in her right knee to see if this will help temporize her symptoms and she agrees with this and tolerated it well.  I did explain the risk and benefits of steroid injections.  I would also like to try an ASO for her left ankle.  I will put her on meloxicam as anti-inflammatory and would like to see her back in 3 weeks to see if she is is improving or not.  Follow-Up Instructions: Return in about 3 weeks (around 09/02/2020).   Orders:  Orders Placed This Encounter  Procedures  . Large Joint Inj  . XR Knee 1-2 Views Right  . XR Ankle 2 Views Left   No orders of the defined types were placed in this encounter.     Procedures: Large Joint Inj: R knee on 08/12/2020 4:05 PM Indications: diagnostic evaluation and pain Details: 22 G 1.5 in needle, superolateral approach  Arthrogram: No  Medications: 3 mL lidocaine 1 %; 40 mg methylPREDNISolone acetate 40 MG/ML Outcome: tolerated well, no immediate complications Procedure, treatment alternatives, risks and benefits explained, specific risks discussed. Consent was given by the patient. Immediately prior to procedure a time out was called to verify the correct patient, procedure, equipment, support staff and site/side marked as required. Patient was prepped and draped in the usual sterile fashion.       Clinical Data: No additional findings.   Subjective: Chief Complaint  Patient presents with  . Right Knee - Pain, Injury  . Left Ankle - Pain, Injury  The  patient is a very active and pleasant 69 year old female who fell down some steps on 7 September injuring her right knee and her left ankle.  She is now developing some left knee pain and left hip pain.  She felt like she was improving and she does not perform line dancing and does some squats but she still getting a lot of medial knee pain with some locking catching on the right knee as well as medial left ankle pain.  She wonders if the ankle pain is because of the walk different as well as the right knee pain that is affecting her left knee and her left hip.  She is not a diabetic.  She is never had surgery on any of her joints.  She is very active.  HPI  Review of Systems She currently denies any headache, chest pain, shortness of breath, fever, chills, nausea, vomiting  Objective: Vital Signs: Ht 5\' 7"  (1.702 m)   Wt 181 lb (82.1 kg)   BMI 28.35 kg/m   Physical Exam She is alert and orient x3 and in no acute distress Ortho Exam Examination of her right knee shows no effusion but definitely medial joint line tenderness.  She has good range of motion of the knee.  Her  left hip and left knee move well.  There is no effusion on either knee.  Both knees are ligamentously stable.  The left ankle shows some medial tenderness over the deltoid ligament and over the anterior talofibular ligament.  There is no ankle swelling.  Her left ankle is stable on ligamentous exam with good range of motion. Specialty Comments:  No specialty comments available.  Imaging: XR Ankle 2 Views Left  Result Date: 08/12/2020 2 views of the left ankle show no acute findings.  XR Knee 1-2 Views Right  Result Date: 08/12/2020 2 views of the right knee show no acute findings.  The joint space is still maintained.    PMFS History: Patient Active Problem List   Diagnosis Date Noted  . Hx of adenomatous polyp of colon 08/03/2019  . 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 Allergy    spring   . Anxiety   . GERD (gastroesophageal reflux disease)   . Hx of adenomatous polyp of colon 08/03/2019  . Hyperlipidemia     Family History  Problem Relation Age of Onset  . Colon cancer Mother 95       80's  . Lung cancer Brother   . Lung cancer Brother   . Breast cancer Neg Hx   . Colon polyps Neg Hx   . Esophageal cancer Neg Hx   . Rectal cancer Neg Hx   . Stomach cancer Neg Hx     Past Surgical History:  Procedure Laterality Date  . COLONOSCOPY    . COLONOSCOPY WITH PROPOFOL N/A 07/31/2019   Procedure: COLONOSCOPY WITH PROPOFOL;  Surgeon: Gatha Mayer, MD;  Location: [facility];  Service: Endoscopy;  Laterality: N/A;  . DILATION AND CURETTAGE OF UTERUS     x 2 per pt  . POLYPECTOMY  07/31/2019   Procedure: POLYPECTOMY;  Surgeon: Gatha Mayer, MD;  Location: [facility];  Service: Endoscopy;;  . TONSILLECTOMY    . WISDOM TOOTH EXTRACTION     Social History   Occupational History  . Not on file  Tobacco Use  . Smoking status: Never Smoker  . Smokeless tobacco: Never Used  Substance and Sexual Activity  . Alcohol use: Yes    Comment: socially   . Drug use: Never  . Sexual activity: Not on file

## 2020-09-02 ENCOUNTER — Encounter: Payer: Self-pay | Admitting: Orthopaedic Surgery

## 2020-09-02 ENCOUNTER — Ambulatory Visit (INDEPENDENT_AMBULATORY_CARE_PROVIDER_SITE_OTHER): Payer: Medicare Other | Admitting: Orthopaedic Surgery

## 2020-09-02 DIAGNOSIS — M25572 Pain in left ankle and joints of left foot: Secondary | ICD-10-CM

## 2020-09-02 DIAGNOSIS — M25561 Pain in right knee: Secondary | ICD-10-CM

## 2020-09-02 NOTE — Progress Notes (Signed)
HPI: [name] returns today follow-up of her right knee and left ankle.  She states that the right knee is much improved since the cortisone injection.  She denies any mechanical symptoms of the knee.  She is asking about what exercises would be appropriate in regards to her knee.  In regards to the left ankle pain is much better wearing the ASO brace.  She still has pain of the lateral aspect of the ankle.  Review of systems: See HPI otherwise negative  Physical exam: General well-developed well-nourished female no acute distress mood and affect appropriate.  Right knee full range of motion without pain.  No abnormal warmth erythema or effusion.  No instability valgus varus stressing.  Slight tenderness over the lateral joint line.  No tenderness of the medial joint line today.  Bilateral ankles good dorsiflexion plantarflexion.  She has 5-5 strength with inversion eversion against resistance bilaterally.  Left foot she slightly supinates with lateral column overload.  Tenderness over the lateral distal fibula and over the left sinus Tarsi region.  No abnormal warmth, erythema or edema of either ankle.  Impression: Right knee pain Left ankle sprain Left foot lateral column overload.  Plan: Discussed with her knee friendly exercises.  We will see her back in regards to the knee if she develops any mechanical symptoms or pain in the knee.  In regards to the left foot and ankle suggest that she get an insert for her left shoe with lateral arch support to offload her lateral column.  She will wean out of the ASO starting first wearing the ankle brace for 2 weeks when outside the home and no longer using it when inside the home.  Been transition to wearing just the insert in her shoe.  She will follow-up with [place] if pain persist or becomes worse.  Questions encouraged and answered by Dr. Ninfa Linden and myself.

## 2020-09-05 ENCOUNTER — Other Ambulatory Visit: Payer: Self-pay | Admitting: Orthopaedic Surgery

## 2020-10-09 ENCOUNTER — Other Ambulatory Visit: Payer: Self-pay | Admitting: Orthopaedic Surgery

## 2020-11-11 ENCOUNTER — Ambulatory Visit: Payer: Self-pay

## 2020-11-11 ENCOUNTER — Ambulatory Visit: Payer: Medicare Other | Admitting: Orthopaedic Surgery

## 2020-11-11 VITALS — Ht 67.0 in | Wt 181.0 lb

## 2020-11-11 DIAGNOSIS — M25552 Pain in left hip: Secondary | ICD-10-CM | POA: Diagnosis not present

## 2020-11-11 DIAGNOSIS — M25551 Pain in right hip: Secondary | ICD-10-CM | POA: Diagnosis not present

## 2020-11-11 MED ORDER — NABUMETONE 500 MG PO TABS: 500.0000 mg | ORAL_TABLET | Freq: Two times a day (BID) | ORAL | 1 refills | Status: DC | PRN

## 2020-11-11 MED ORDER — METHOCARBAMOL 500 MG PO TABS: 500.0000 mg | ORAL_TABLET | Freq: Four times a day (QID) | ORAL | 1 refills | Status: DC | PRN

## 2020-11-11 MED ORDER — METHYLPREDNISOLONE 4 MG PO TABS: ORAL_TABLET | ORAL | 0 refills | Status: DC

## 2020-11-11 NOTE — Progress Notes (Signed)
Office Visit Note   Patient: Karla Moore           Date of Birth: 12-05-1950           MRN: [id number] Visit Date: 11/11/2020              Requested by: Michael Boston, MD [address] [place],  [place] [address] PCP: Michael Boston, MD   Assessment & Plan: Visit Diagnoses:  1. Pain in left hip   2. Pain in right hip     Plan: I did recommend Voltaren gel combined with a steroid taper and Relafen.  She is the perfect candidate for outpatient physical therapy for any modalities that can help her hip and low back pain bilaterally with trochanteric bursitis and IT band syndrome.  Any modalities per therapist discretion would be appropriate.  She agrees with this treatment plan.  I will see her back in 6 weeks to see how she is doing overall.  I would certainly consider bilateral steroid injections of the trochanteric areas if needed since she is not a diabetic.  All questions and concerns were answered and addressed.  Follow-Up Instructions: Return in about 6 weeks (around 12/23/2020).   Orders:  Orders Placed This Encounter  Procedures  . XR HIPS BILAT W OR W/O PELVIS 2V   Meds ordered this encounter  Medications  . methylPREDNISolone (MEDROL) 4 MG tablet    Sig: Medrol dose pack. Take as instructed    Dispense:  21 tablet    Refill:  0  . methocarbamol (ROBAXIN) 500 MG tablet    Sig: Take 1 tablet (500 mg total) by mouth every 6 (six) hours as needed.    Dispense:  40 tablet    Refill:  1  . nabumetone (RELAFEN) 500 MG tablet    Sig: Take 1 tablet (500 mg total) by mouth 2 (two) times daily as needed.    Dispense:  60 tablet    Refill:  1      Procedures: No procedures performed   Clinical Data: No additional findings.   Subjective: Chief Complaint  Patient presents with  . Left Hip - Pain  . Right Hip - Pain  The patient is a 70 year old female who comes in for evaluation treatment of bilateral hip pain and low back pain for about 3 weeks now.  She  occasionally gets pain in the groin as well.  She has been told that it is hip bursitis.  She does report pain over the lower aspect of both hips when she lays down and she moves back and forth with both hips hurting when she is in bed.  She is trying to stay active.  She is a young appearing 70 year old female.  She has tried meloxicam once and she felt like it may have made her dizzy gave her headache.  She denies any specific injury  HPI  Review of Systems Today she denies any headache, chest pain, shortness of breath, fever, chills, nausea, vomiting  Objective: Vital Signs: Ht 5\' 7"  (1.702 m)   Wt 181 lb (82.1 kg)   BMI 28.35 kg/m   Physical Exam She is alert and orient x3 and in no acute distress Ortho Exam Examination of both hips show the move smoothly and normally and fluidly with no pain in the groin at all.  She has pain to palpation which is quite significant over both trochanteric areas and IT bands.  She does have some low back pain  that is potentially facet joint mediated bilaterally. Specialty Comments:  No specialty comments available.  Imaging: XR HIPS BILAT W OR W/O PELVIS 2V  Result Date: 11/11/2020 An AP pelvis and lateral both hips shows normal-appearing hips with congruent joint spaces.    PMFS History: Patient Active Problem List   Diagnosis Date Noted  . Hx of adenomatous polyp of colon 08/03/2019  . 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 Allergy    spring   . Anxiety   . GERD (gastroesophageal reflux disease)   . Hx of adenomatous polyp of colon 08/03/2019  . Hyperlipidemia     Family History  Problem Relation Age of Onset  . Colon cancer Mother 33       80's  . Lung cancer Brother   . Lung cancer Brother   . Breast cancer Neg Hx   . Colon polyps Neg Hx   . Esophageal cancer Neg Hx   . Rectal cancer Neg Hx   . Stomach cancer Neg Hx     Past Surgical History:  Procedure Laterality Date   . COLONOSCOPY    . COLONOSCOPY WITH PROPOFOL N/A 07/31/2019   Procedure: COLONOSCOPY WITH PROPOFOL;  Surgeon: Gatha Mayer, MD;  Location: [facility];  Service: Endoscopy;  Laterality: N/A;  . DILATION AND CURETTAGE OF UTERUS     x 2 per pt  . POLYPECTOMY  07/31/2019   Procedure: POLYPECTOMY;  Surgeon: Gatha Mayer, MD;  Location: [facility];  Service: Endoscopy;;  . TONSILLECTOMY    . WISDOM TOOTH EXTRACTION     Social History   Occupational History  . Not on file  Tobacco Use  . Smoking status: Never Smoker  . Smokeless tobacco: Never Used  Substance and Sexual Activity  . Alcohol use: Yes    Comment: socially   . Drug use: Never  . Sexual activity: Not on file

## 2020-11-12 ENCOUNTER — Other Ambulatory Visit: Payer: Self-pay

## 2020-11-12 DIAGNOSIS — M25552 Pain in left hip: Secondary | ICD-10-CM

## 2020-11-12 DIAGNOSIS — M25551 Pain in right hip: Secondary | ICD-10-CM

## 2020-12-03 ENCOUNTER — Encounter: Payer: Self-pay | Admitting: Rehabilitative and Restorative Service Providers"

## 2020-12-03 ENCOUNTER — Ambulatory Visit: Payer: Medicare Other | Admitting: Rehabilitative and Restorative Service Providers"

## 2020-12-03 ENCOUNTER — Other Ambulatory Visit: Payer: Self-pay

## 2020-12-03 DIAGNOSIS — M25552 Pain in left hip: Secondary | ICD-10-CM | POA: Diagnosis not present

## 2020-12-03 DIAGNOSIS — M25551 Pain in right hip: Secondary | ICD-10-CM | POA: Diagnosis not present

## 2020-12-03 DIAGNOSIS — M6281 Muscle weakness (generalized): Secondary | ICD-10-CM

## 2020-12-03 DIAGNOSIS — G8929 Other chronic pain: Secondary | ICD-10-CM

## 2020-12-03 DIAGNOSIS — M545 Low back pain, unspecified: Secondary | ICD-10-CM | POA: Diagnosis not present

## 2020-12-03 DIAGNOSIS — R262 Difficulty in walking, not elsewhere classified: Secondary | ICD-10-CM

## 2020-12-03 NOTE — Therapy (Addendum)
[facility] [address] [place], [place], [address] Phone: [phone]   Fax:  [phone]  Physical Therapy Evaluation/Discharge  Patient Details  Name: Kriston Mckinnie Hisaw MRN: [id number] Date of Birth: [birth year] Referring Provider (PT): Dr. Ninfa Linden   Encounter Date: 12/03/2020   PT End of Session - 12/03/20 1350    Visit Number 1    Number of Visits 12    Date for PT Re-Evaluation 01/28/21    PT Start Time 1350    PT Stop Time 1428    PT Time Calculation (min) 38 min    Activity Tolerance Patient tolerated treatment well    Behavior During Therapy [facility] for tasks assessed/performed           Past Medical History:  Diagnosis Date  . Allergy    spring   . Anxiety   . GERD (gastroesophageal reflux disease)   . Hx of adenomatous polyp of colon 08/03/2019  . Hyperlipidemia     Past Surgical History:  Procedure Laterality Date  . COLONOSCOPY    . COLONOSCOPY WITH PROPOFOL N/A 07/31/2019   Procedure: COLONOSCOPY WITH PROPOFOL;  Surgeon: Gatha Mayer, MD;  Location: [facility];  Service: Endoscopy;  Laterality: N/A;  . DILATION AND CURETTAGE OF UTERUS     x 2 per pt  . POLYPECTOMY  07/31/2019   Procedure: POLYPECTOMY;  Surgeon: Gatha Mayer, MD;  Location: [facility];  Service: Endoscopy;;  . TONSILLECTOMY    . WISDOM TOOTH EXTRACTION      There were no vitals filed for this visit.    Subjective Assessment - 12/03/20 1354    Subjective Pt. stated having complaints in lower back,bilateral  lateral/posterior hip primary.  Pt. stated using Voltaren gel and taking steriod taper, relafen.   Pt. stated she used to walk 3 miles but currently limited to 1.5 miles due to pain symptoms and has trouble at night due to hip complaints.  Pt. stated onset of symptoms around start of year.  History of Lt hip pain years ago and injection helped.  Pt. stated having hip complaints at times without back complaints. No bowel or bladder  control changes.    Limitations Walking;Standing;House hold activities    Patient Stated Goals Reduce pain, sleep better, walk better    Currently in Pain? Yes    Pain Score 8     Pain Location Hip    Pain Orientation Left;Right    Pain Descriptors / Indicators Aching;Constant    Pain Type Chronic pain    Pain Onset More than a month ago    Pain Frequency Constant    Aggravating Factors  sleeping, walking prolonged    Pain Relieving Factors Gel/medicine    Effect of Pain on Daily Activities Limited in cardio/line dancing due to symptoms    Multiple Pain Sites Yes    Pain Score 7    Pain Location Back    Pain Orientation Lower    Pain Descriptors / Indicators Aching;Constant    Pain Onset More than a month ago    Pain Frequency Constant    Aggravating Factors  similar to hip, vacuum cleaner    Pain Relieving Factors similar to hip              [facility] PT Assessment - 12/03/20 0001      Assessment   Medical Diagnosis low back, bilateral hip pain    Referring Provider (PT) Dr. Ninfa Linden    Onset Date/Surgical Date 09/28/20  Hand Dominance Right      Precautions   Precautions None      Restrictions   Weight Bearing Restrictions No      Balance Screen   Has the patient fallen in the past 6 months Yes    How many times? 1   at [facility]   Has the patient had a decrease in activity level because of a fear of falling?  No    Is the patient reluctant to leave their home because of a fear of falling?  No      Home Ecologist residence    Additional Comments no stairs at home      Prior Function   Level of Independence Independent    Leisure Walking for exercise, [employer], dancing      Observation/Other Assessments   Focus on Therapeutic Outcomes (FOTO)  intake 59%, expected out 75%      Functional Tests   Functional tests Single leg stance      Single Leg Stance   Comments Lt and Rt SLS < 5 seconds c trendelenburg noted bilateral       Posture/Postural Control   Posture Comments Even iliac crest in standing, unremarkable lumbar alignment.      ROM / Strength   AROM / PROM / Strength Strength;PROM;AROM      AROM   AROM Assessment Site Lumbar;Hip    Right/Left Hip Left;Right    Lumbar Flexion movement to toes, no change in symptoms    Lumbar Extension 50% ERP, REIS 75% c tightness      PROM   PROM Assessment Site Lumbar;Hip    Right/Left Hip Left;Right      Strength   Overall Strength Comments Pain indicated in ipsilateral hip respected to MMT for flexion, abd, IR bilateral    Strength Assessment Site Hip;Knee;Ankle    Right/Left Hip Left;Right    Right Hip Flexion 5/5    Right Hip Extension 5/5    Right Hip External Rotation  5/5    Right Hip Internal Rotation 5/5    Right Hip ABduction 4+/5    Left Hip Flexion 5/5    Left Hip Extension 5/5    Left Hip External Rotation 5/5    Left Hip Internal Rotation 5/5    Left Hip ABduction 4/5    Right/Left Knee Left;Right    Right Knee Flexion 5/5    Right Knee Extension 5/5    Left Knee Flexion 5/5    Left Knee Extension 5/5    Right/Left Ankle Left;Right    Right Ankle Dorsiflexion 5/5    Left Ankle Dorsiflexion 5/5      Flexibility   Soft Tissue Assessment /Muscle Length yes    Hamstrings passive SLR bilateral 70 deg c no concordant symptoms      Palpation   Spinal mobility Mild restriction L4, L5 cPA c discomfort L4, L5    Palpation comment Numerous Trigger points in glute med/max bilateral c concordant symptoms in respective hips.      Special Tests   Other special tests (-) slump, crossed SLR bilateral      Transfers   Comments back pain c 18 inch transfer no UE      Ambulation/Gait   Gait Comments Mild trendelenberg noted bilateral stance                      Objective measurements completed on examination: See above findings.  [facility] PT Treatment/Exercise - 12/03/20 0001      Exercises   Exercises Other  Exercises;Lumbar    Other Exercises  HEP instruction/performance c cues for techniques, trial set of each exercise for comprehension and symptom response assessment.  HEP consisting of lumbar extension standing, supine SK to opposite shoulder 15 sec stretch, supine LTR 15 sec stretch bilateral, sidelying hip abduction bilateral      Manual Therapy   Manual therapy comments compression to Lt glute med, g3 L3, L4, L5 cPA                  PT Education - 12/03/20 1350    Education Details HEP, POC    Person(s) Educated Patient    Methods Explanation;Demonstration;Verbal cues;Handout    Comprehension Verbalized understanding;Returned demonstration            PT Short Term Goals - 12/03/20 1350      PT SHORT TERM GOAL #1   Title Patient will demonstrate independent use of home exercise program to maintain progress from in clinic treatments.    Time 3    Period Weeks    Status New    Target Date 12/24/20             PT Long Term Goals - 12/03/20 1429      PT LONG TERM GOAL #1   Title Patient will demonstrate/report pain at worst less than or equal to 2/10 to facilitate minimal limitation in daily activity secondary to pain symptoms.    Time 8    Period Weeks    Status New    Target Date 01/28/21      PT LONG TERM GOAL #2   Title Patient will demonstrate independent use of home exercise program to facilitate ability to maintain/progress functional gains from skilled physical therapy services.    Time 8    Period Weeks    Status New    Target Date 01/28/21      PT LONG TERM GOAL #3   Title Patient will demonstrate lumbar extension 100 % WFL s symptoms to facilitate upright standing, walking posture at PLOF s limitation.    Time 8    Period Weeks    Status New    Target Date 01/28/21      PT LONG TERM GOAL #4   Title Pt. will demonstrate bilateral hip MMT 5/5 throughout to faciltate usual standing, walking, mobility at PLOF s limitation.    Time 8    Period  Weeks    Status New    Target Date 01/28/21      PT LONG TERM GOAL #5   Title Pt. will demonstrate/report ability to walk unrestricted due to symptoms.    Time 8    Period Weeks    Status New    Target Date 01/28/21      Additional Long Term Goals   Additional Long Term Goals Yes      PT LONG TERM GOAL #6   Title Pt. will demonstrate SLS > 15 seconds bilateral s Trendelenberg for improved stability in ambulation.    Time 8    Period Weeks    Status New    Target Date 01/28/21      PT LONG TERM GOAL #7   Title Pt. will demonstrate FOTO > = 75 % to indicate reduced disability due to condition.    Time 8    Period Weeks    Status New    Target Date  01/28/21                  Plan - 12/03/20 1349    Clinical Impression Statement Patient is a 70 y.o. female who comes to clinic with complaints of low back, bilateral hip pain with mobility, strength and movement coordination deficits that impair their ability to perform usual daily and recreational functional activities without increase difficulty/symptoms at this time.  Patient to benefit from skilled PT services to address impairments and limitations to improve to previous level of function without restriction secondary to condition.    Personal Factors and Comorbidities Comorbidity 2    Comorbidities Hyperlipidemia, GERD    Examination-Activity Limitations Sleep;Squat;Bend;Stairs;Stand;Locomotion Level;Transfers    Examination-Participation Restrictions Community Activity;Other   exercise, [employer] attendance   Stability/Clinical Decision Making Stable/Uncomplicated    Clinical Decision Making Low    Rehab Potential Good    PT Frequency --   1-2x/week   PT Duration 8 weeks    PT Treatment/Interventions ADLs/Self Care Home Management;Cryotherapy;Electrical Stimulation;Iontophoresis 20m/ml Dexamethasone;Moist Heat;Traction;Balance training;Therapeutic exercise;Therapeutic activities;Functional mobility training;Stair  training;Gait training;DME Instruction;Ultrasound;Neuromuscular re-education;Patient/family education;Passive range of motion;Dry needling;Spinal Manipulations;Joint Manipulations;Taping;Manual techniques    PT Next Visit Plan Possible glute med DN/trigger point release.  Continued lumbar and hip mobility gains (manual, ther ex), hip strength/stability.    PT Home Exercise Plan [email or web address]   Consulted and Agree with Plan of Care Patient           Patient will benefit from skilled therapeutic intervention in order to improve the following deficits and impairments:  Abnormal gait,Decreased endurance,Hypomobility,Decreased activity tolerance,Decreased strength,Increased fascial restricitons,Pain,Difficulty walking,Decreased mobility,Decreased balance,Decreased range of motion,Impaired perceived functional ability,Improper body mechanics,Impaired flexibility,Decreased coordination  Visit Diagnosis: Chronic midline low back pain without sciatica  Pain in left hip  Pain in right hip  Muscle weakness (generalized)  Difficulty in walking, not elsewhere classified     Problem List Patient Active Problem List   Diagnosis Date Noted  . Hx of adenomatous polyp of colon 08/03/2019  . 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 Patient is being discharged due to not returning since the last visit.  ?????    Came eval only then didn't return.  MScot Jun PT, DPT, OCS, ATC 01/23/21  1:37 PM       [facility] [address] [address]Phone: [phone]  Fax:   [phone] Name: PJann MilkovichMarrow MRN: [id number] of Birth: [birth year]

## 2020-12-03 NOTE — Patient Instructions (Signed)
Access Code: [email or web address] URL: [email or web address] Date: 12/03/2020 Prepared by: Scot Jun  Exercises Supine Lower Trunk Rotation - 2 x daily - 7 x weekly - 1 sets - 5 reps - 15 hold Sidelying Hip Abduction - 2 x daily - 7 x weekly - 3 sets - 10 reps Standing Lumbar Extension - 2 x daily - 7 x weekly - 2-3 sets - 10 reps Supine Piriformis Stretch with Leg Straight - 2 x daily - 7 x weekly - 1 sets - 5 reps - 15 hold  Patient Education Trigger Point Dry Needling

## 2020-12-19 ENCOUNTER — Encounter: Payer: Medicare Other | Admitting: Rehabilitative and Restorative Service Providers"

## 2020-12-23 ENCOUNTER — Ambulatory Visit: Payer: Medicare Other | Admitting: Orthopaedic Surgery

## 2020-12-23 ENCOUNTER — Encounter: Payer: Medicare Other | Admitting: Rehabilitative and Restorative Service Providers"

## 2020-12-23 ENCOUNTER — Encounter: Payer: Self-pay | Admitting: Orthopaedic Surgery

## 2020-12-23 DIAGNOSIS — M7062 Trochanteric bursitis, left hip: Secondary | ICD-10-CM

## 2020-12-23 DIAGNOSIS — M7061 Trochanteric bursitis, right hip: Secondary | ICD-10-CM

## 2020-12-23 DIAGNOSIS — M25552 Pain in left hip: Secondary | ICD-10-CM

## 2020-12-23 DIAGNOSIS — M25551 Pain in right hip: Secondary | ICD-10-CM

## 2020-12-23 MED ORDER — LIDOCAINE HCL 1 % IJ SOLN
3.0000 mL | INTRAMUSCULAR | Status: AC | PRN
Start: 2020-12-23 — End: 2020-12-23
  Administered 2020-12-23: 3 mL

## 2020-12-23 MED ORDER — LIDOCAINE HCL 1 % IJ SOLN
3.0000 mL | INTRAMUSCULAR | Status: AC | PRN
  Administered 2020-12-23: 3 mL

## 2020-12-23 MED ORDER — METHYLPREDNISOLONE ACETATE 40 MG/ML IJ SUSP
40.0000 mg | INTRAMUSCULAR | Status: AC | PRN
  Administered 2020-12-23: 40 mg via INTRA_ARTICULAR

## 2020-12-23 NOTE — Progress Notes (Signed)
Office Visit Note   Patient: Karla Moore           Date of Birth: [birth year]           MRN: [id number] Visit Date: 12/23/2020              Requested by: Michael Boston, MD [address] [place],  [place] [address] PCP: Michael Boston, MD   Assessment & Plan: Visit Diagnoses:  1. Pain in left hip   2. Pain in right hip   3. Trochanteric bursitis, left hip   4. Trochanteric bursitis, right hip     Plan: Per the patient's request I did try and place steroid injections around both hip trochanteric areas which she tolerated well.  She will continue her regular stretching as well as topical anti-inflammatories and activity modification.  She knows to wait at least 3 months between injections.  All questions and concerns were answered and addressed.  Follow-Up Instructions: Return if symptoms worsen or fail to improve.   Orders:  Orders Placed This Encounter  Procedures  . Large Joint Inj  . Large Joint Inj  . Large Joint Inj   No orders of the defined types were placed in this encounter.     Procedures: Large Joint Inj: R greater trochanter on 12/23/2020 1:33 PM Indications: pain and diagnostic evaluation Details: 22 G 1.5 in needle, lateral approach  Arthrogram: No  Medications: 3 mL lidocaine 1 %; 40 mg methylPREDNISolone acetate 40 MG/ML Outcome: tolerated well, no immediate complications Procedure, treatment alternatives, risks and benefits explained, specific risks discussed. Consent was given by the patient. Immediately prior to procedure a time out was called to verify the correct patient, procedure, equipment, support staff and site/side marked as required. Patient was prepped and draped in the usual sterile fashion.   Large Joint Inj: L greater trochanter on 12/23/2020 1:33 PM Indications: pain and diagnostic evaluation Details: 22 G 1.5 in needle, lateral approach  Arthrogram: No  Medications: 3 mL lidocaine 1 %; 40 mg methylPREDNISolone acetate 40  MG/ML Outcome: tolerated well, no immediate complications Procedure, treatment alternatives, risks and benefits explained, specific risks discussed. Consent was given by the patient. Immediately prior to procedure a time out was called to verify the correct patient, procedure, equipment, support staff and site/side marked as required. Patient was prepped and draped in the usual sterile fashion.       Clinical Data: No additional findings.   Subjective: Chief Complaint  Patient presents with  . Left Hip - Follow-up  . Right Hip - Follow-up  The patient comes in today requesting steroid injections with both her hips over the trochanteric area.  We have seen her for trochanteric bursitis before and provided injections.  She is not a diabetic.  She has been compliant with going to physical therapy which did not help for both hips.  She tries Voltaren gel and anti-inflammatories as well as activity modification.  She still denies any groin pain.  She denies any acute change in medical status.  HPI  Review of Systems There is no listed headache, chest pain, shortness of breath, fever, chills, nausea, vomiting  Objective: Vital Signs: There were no vitals taken for this visit.  Physical Exam She is alert and orient x3 and in no acute distress Ortho Exam Examination of both hips show the move smoothly and fluidly.  She has significant pain and sensitivity over the trochanteric area of both sides consistent with trochanteric bursitis. Specialty Comments:  No specialty comments available.  Imaging: No results found.   PMFS History: Patient Active Problem List   Diagnosis Date Noted  . Hx of adenomatous polyp of colon 08/03/2019  . 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 Allergy    spring   . Anxiety   . GERD (gastroesophageal reflux disease)   . Hx of adenomatous polyp of colon 08/03/2019  . Hyperlipidemia     Family  History  Problem Relation Age of Onset  . Colon cancer Mother 11       80's  . Lung cancer Brother   . Lung cancer Brother   . Breast cancer Neg Hx   . Colon polyps Neg Hx   . Esophageal cancer Neg Hx   . Rectal cancer Neg Hx   . Stomach cancer Neg Hx     Past Surgical History:  Procedure Laterality Date  . COLONOSCOPY    . COLONOSCOPY WITH PROPOFOL N/A 07/31/2019   Procedure: COLONOSCOPY WITH PROPOFOL;  Surgeon: Gatha Mayer, MD;  Location: [facility];  Service: Endoscopy;  Laterality: N/A;  . DILATION AND CURETTAGE OF UTERUS     x 2 per pt  . POLYPECTOMY  07/31/2019   Procedure: POLYPECTOMY;  Surgeon: Gatha Mayer, MD;  Location: [facility];  Service: Endoscopy;;  . TONSILLECTOMY    . WISDOM TOOTH EXTRACTION     Social History   Occupational History  . Not on file  Tobacco Use  . Smoking status: Never Smoker  . Smokeless tobacco: Never Used  Substance and Sexual Activity  . Alcohol use: Yes    Comment: socially   . Drug use: Never  . Sexual activity: Not on file

## 2021-01-02 ENCOUNTER — Encounter: Payer: Medicare Other | Admitting: Rehabilitative and Restorative Service Providers"

## 2021-03-24 ENCOUNTER — Other Ambulatory Visit: Payer: Self-pay | Admitting: Internal Medicine

## 2021-03-24 DIAGNOSIS — Z1231 Encounter for screening mammogram for malignant neoplasm of breast: Secondary | ICD-10-CM

## 2021-04-14 ENCOUNTER — Ambulatory Visit: Payer: Medicare Other | Attending: Internal Medicine

## 2021-04-14 ENCOUNTER — Other Ambulatory Visit (HOSPITAL_BASED_OUTPATIENT_CLINIC_OR_DEPARTMENT_OTHER): Payer: Self-pay

## 2021-04-14 ENCOUNTER — Other Ambulatory Visit: Payer: Self-pay

## 2021-04-14 DIAGNOSIS — Z23 Encounter for immunization: Secondary | ICD-10-CM

## 2021-04-14 MED ORDER — PFIZER-BIONT COVID-19 VAC-TRIS 30 MCG/0.3ML IM SUSP
INTRAMUSCULAR | 0 refills | Status: DC
  Filled 2021-04-14: qty 0.3, 1d supply, fill #0

## 2021-04-14 NOTE — Progress Notes (Signed)
   Covid-19 Vaccination Clinic  Name:  Karla Moore    MRN: [id number] DOB: [birth year]  04/14/2021  Karla Moore was observed post Covid-19 immunization for 15 minutes without incident. She was provided with Vaccine Information Sheet and instruction to access the V-Safe system.   Karla Moore was instructed to call 911 with any severe reactions post vaccine: Difficulty breathing  Swelling of face and throat  A fast heartbeat  A bad rash all over body  Dizziness and weakness   Immunizations Administered     Name Date Dose VIS Date Route   PFIZER Comrnaty(Gray TOP) Covid-19 Vaccine 04/14/2021  2:36 PM 0.3 mL 09/05/2020 Intramuscular   Manufacturer: [place]   Lot: [id number]   [place]: [phone]

## 2021-05-29 ENCOUNTER — Other Ambulatory Visit: Payer: Self-pay

## 2021-05-29 ENCOUNTER — Ambulatory Visit
Admission: RE | Admit: 2021-05-29 | Discharge: 2021-05-29 | Disposition: A | Discharge status: Home / Self Care | Payer: Medicare Other | Source: Ambulatory Visit | Attending: Internal Medicine | Admitting: Internal Medicine

## 2021-05-29 DIAGNOSIS — Z1231 Encounter for screening mammogram for malignant neoplasm of breast: Secondary | ICD-10-CM

## 2021-08-13 ENCOUNTER — Other Ambulatory Visit: Payer: Self-pay

## 2021-08-13 ENCOUNTER — Other Ambulatory Visit (HOSPITAL_BASED_OUTPATIENT_CLINIC_OR_DEPARTMENT_OTHER): Payer: Self-pay

## 2021-08-13 ENCOUNTER — Ambulatory Visit: Payer: Medicare Other | Attending: Internal Medicine

## 2021-08-13 DIAGNOSIS — Z23 Encounter for immunization: Secondary | ICD-10-CM

## 2021-08-13 MED ORDER — PFIZER COVID-19 VAC BIVALENT 30 MCG/0.3ML IM SUSP
INTRAMUSCULAR | 0 refills | Status: DC
  Filled 2021-08-13: qty 0.3, 1d supply, fill #0

## 2021-08-13 NOTE — Progress Notes (Signed)
   Covid-19 Vaccination Clinic  Name:  Karla Moore    MRN: [id number] DOB: [birth year]  08/13/2021  [name] was observed post Covid-19 immunization for 15 minutes without incident. She was provided with Vaccine Information Sheet and instruction to access the V-Safe system.   [name] was instructed to call 911 with any severe reactions post vaccine: Difficulty breathing  Swelling of face and throat  A fast heartbeat  A bad rash all over body  Dizziness and weakness   Immunizations Administered     Name Date Dose VIS Date Route   Pfizer Covid-19 Vaccine Bivalent Booster 08/13/2021  2:32 PM 0.3 mL 05/28/2021 Intramuscular   Manufacturer: [place]   Lot: [id number]   [place]: [phone]

## 2021-08-15 ENCOUNTER — Other Ambulatory Visit: Payer: Self-pay | Admitting: Internal Medicine

## 2021-08-15 DIAGNOSIS — E785 Hyperlipidemia, unspecified: Secondary | ICD-10-CM

## 2021-09-09 ENCOUNTER — Ambulatory Visit
Admission: RE | Admit: 2021-09-09 | Discharge: 2021-09-09 | Disposition: A | Discharge status: Home / Self Care | Payer: No Typology Code available for payment source | Source: Ambulatory Visit | Attending: Internal Medicine | Admitting: Internal Medicine

## 2021-09-09 DIAGNOSIS — E785 Hyperlipidemia, unspecified: Secondary | ICD-10-CM

## 2021-11-04 ENCOUNTER — Other Ambulatory Visit: Payer: Self-pay | Admitting: Internal Medicine

## 2021-11-06 ENCOUNTER — Other Ambulatory Visit: Payer: Self-pay | Admitting: Internal Medicine

## 2021-11-06 DIAGNOSIS — N644 Mastodynia: Secondary | ICD-10-CM

## 2021-11-13 ENCOUNTER — Ambulatory Visit
Admission: RE | Admit: 2021-11-13 | Discharge: 2021-11-13 | Disposition: A | Discharge status: Home / Self Care | Payer: Medicare Other | Source: Ambulatory Visit | Attending: Internal Medicine | Admitting: Internal Medicine

## 2021-11-13 DIAGNOSIS — N644 Mastodynia: Secondary | ICD-10-CM

## 2022-03-09 ENCOUNTER — Encounter: Payer: Self-pay | Admitting: Internal Medicine

## 2022-03-09 ENCOUNTER — Ambulatory Visit (INDEPENDENT_AMBULATORY_CARE_PROVIDER_SITE_OTHER): Payer: Medicare Other | Admitting: Internal Medicine

## 2022-03-09 VITALS — BP 117/66 | HR 76 | Temp 98.2°F | Ht 67.0 in | Wt 178.4 lb

## 2022-03-09 DIAGNOSIS — R7302 Impaired glucose tolerance (oral): Secondary | ICD-10-CM

## 2022-03-09 DIAGNOSIS — E782 Mixed hyperlipidemia: Secondary | ICD-10-CM | POA: Diagnosis not present

## 2022-03-09 DIAGNOSIS — Z86010 Personal history of colonic polyps: Secondary | ICD-10-CM

## 2022-03-09 DIAGNOSIS — J302 Other seasonal allergic rhinitis: Secondary | ICD-10-CM | POA: Diagnosis not present

## 2022-03-09 DIAGNOSIS — R519 Headache, unspecified: Secondary | ICD-10-CM

## 2022-03-09 LAB — LIPID PANEL
Cholesterol: 216 mg/dL — ABNORMAL HIGH (ref 0–200)
HDL: 66.4 mg/dL (ref >39.00)
LDL Cholesterol: 130 mg/dL — ABNORMAL HIGH (ref 0–99)
NonHDL: 149.73
Total CHOL/HDL Ratio: 3
Triglycerides: 101 mg/dL (ref 0.0–149.0)
VLDL: 20.2 mg/dL (ref 0.0–40.0)

## 2022-03-09 LAB — POCT GLYCOSYLATED HEMOGLOBIN (HGB A1C): Hemoglobin A1C: 6.3 % — AB (ref 4.0–5.6)

## 2022-03-09 NOTE — Progress Notes (Signed)
New Patient Office Visit     CC/Reason for Visit: Establish care, discuss chronic medical conditions Previous PCP: Cristie Hem, MD Last Visit: October 2022  HPI: Karla Moore is a 71 y.o. female who is coming in today for the above mentioned reasons. Past Medical History is significant for: Impaired glucose tolerance with a recent A1c of 6.1, hyperlipidemia who takes rosuvastatin 5 mg only 2 days a week due to significant joint aches.  She did have a coronary CT scan in November 2022 that showed a score of 36.6 which is 69th percentile for her age, sex and race.  She is due for her second shingles and a pneumonia vaccine.  She is also due for a Pap smear but other cancer screening is up-to-date.  She is a never smoker, occasional alcohol use, no known drug allergies.  Past surgical history only significant for tonsillectomy.  Her family history significant for a mother and sister with diabetes.  Her mother also died from colon cancer at age 68.  She is currently retired, she is married, she has 1 adopted son.  She has been having some postnasal drip and frontal headaches.   Past Medical/Surgical History: Past Medical History:  Diagnosis Date   Allergy    spring    Anxiety    GERD (gastroesophageal reflux disease)    Hx of adenomatous polyp of colon 08/03/2019   Hyperlipidemia     Past Surgical History:  Procedure Laterality Date   COLONOSCOPY     COLONOSCOPY WITH PROPOFOL N/A 07/31/2019   Procedure: COLONOSCOPY WITH PROPOFOL;  Surgeon: Gatha Mayer, MD;  Location: [facility];  Service: Endoscopy;  Laterality: N/A;   DILATION AND CURETTAGE OF UTERUS     x 2 per pt   POLYPECTOMY  07/31/2019   Procedure: POLYPECTOMY;  Surgeon: Gatha Mayer, MD;  Location: [facility];  Service: Endoscopy;;   TONSILLECTOMY     WISDOM TOOTH EXTRACTION      Social History:  reports that she has never smoked. She has never used smokeless tobacco. She reports current alcohol use. She  reports that she does not use drugs.  Allergies: Allergies  Allergen Reactions   Ibuprofen Other (See Comments)    Pt stated, "It makes me feel like my heart is expanding; gave me pain"    Family History:  Family History  Problem Relation Age of Onset   Colon cancer Mother 23       80's   Lung cancer Brother    Lung cancer Brother    Breast cancer Neg Hx    Colon polyps Neg Hx    Esophageal cancer Neg Hx    Rectal cancer Neg Hx    Stomach cancer Neg Hx      Current Outpatient Medications:    Acetaminophen (TYLENOL 8 HOUR ARTHRITIS PAIN PO), Take by mouth., Disp: , Rfl:    acetaminophen (TYLENOL) 500 MG tablet, Take 500 mg by mouth every 6 (six) hours as needed (Cold). , Disp: , Rfl:    ALPRAZolam (XANAX) 0.25 MG tablet, Take 0.25 mg by mouth as needed for anxiety. , Disp: , Rfl:    cholecalciferol (VITAMIN D) 1000 units tablet, Take 2,000 Units by mouth daily., Disp: , Rfl:    ferrous sulfate (FEROSUL) 325 (65 FE) MG tablet, Take 325 mg by mouth daily with breakfast. Three times a week, Disp: , Rfl:    Magnesium 250 MG TABS, Take 500 mg by mouth daily. Do not take  on the weekends, Disp: , Rfl:    Multiple Vitamin (MULTIVITAMIN) tablet, Take 1 tablet by mouth daily. Do no take on the Weekend, Disp: , Rfl:    omeprazole (PRILOSEC) 20 MG capsule, Take 20 mg by mouth every other day., Disp: , Rfl:    rosuvastatin (CRESTOR) 5 MG tablet, Take 5 mg by mouth. Take one tablet twice weekly, Disp: , Rfl:    vitamin E 200 UNIT capsule, Take 200 Units by mouth daily. Do not take on Weekends, Disp: , Rfl:   Review of Systems:  Constitutional: Denies fever, chills, diaphoresis, appetite change and fatigue.  HEENT: Denies photophobia, eye pain, redness, hearing loss, ear pain, congestion, sore throat, rhinorrhea, sneezing, mouth sores, trouble swallowing, neck pain, neck stiffness and tinnitus.   Respiratory: Denies SOB, DOE, cough, chest tightness,  and wheezing.   Cardiovascular: Denies  chest pain, palpitations and leg swelling.  Gastrointestinal: Denies nausea, vomiting, abdominal pain, diarrhea, constipation, blood in stool and abdominal distention.  Genitourinary: Denies dysuria, urgency, frequency, hematuria, flank pain and difficulty urinating.  Endocrine: Denies: hot or cold intolerance, sweats, changes in hair or nails, polyuria, polydipsia. Musculoskeletal: Denies myalgias, back pain, joint swelling, arthralgias and gait problem.  Skin: Denies pallor, rash and wound.  Neurological: Denies dizziness, seizures, syncope, weakness,  numbness . Hematological: Denies adenopathy. Easy bruising, personal or family bleeding history  Psychiatric/Behavioral: Denies suicidal ideation, mood changes, confusion, nervousness, sleep disturbance and agitation    Physical Exam: Vitals:   03/09/22 1304 03/09/22 1306  BP: 140/90 117/66  Pulse: 76   Temp: 98.2 F (36.8 C)   TempSrc: Oral   SpO2: 94%   Weight: 178 lb 6.4 oz (80.9 kg)   Height: '5\' 7"'$  (1.702 m)    Body mass index is 27.94 kg/m.   Constitutional: NAD, calm, comfortable Eyes: PERRL, lids and conjunctivae normal ENMT: Mucous membranes are moist.  Respiratory: clear to auscultation bilaterally, no wheezing, no crackles. Normal respiratory effort. No accessory muscle use.  Cardiovascular: Regular rate and rhythm, no murmurs / rubs / gallops. No extremity edema.  Neurologic: Grossly intact and nonfocal. Psychiatric: Normal judgment and insight. Alert and oriented x 3. Normal mood.    Impression and Plan:  IGT (impaired glucose tolerance)  - Plan: POCT glycosylated hemoglobin (Hb A1C) -A1c has increased to 6.3, will continue to follow, lifestyle changes advised.  Hx of adenomatous polyp of colon -Last colonoscopy was in 07/2019 and was advised 7-year follow-up.  Mixed hyperlipidemia  - Plan: Lipid panel -Given results of her coronary CT and an LDL of 152, I agree with statin use, will need to see if she can  increase weekly usage or maybe add a nonstatin type medication.  Seasonal allergies Acute nonintractable headache, unspecified headache type -I believe this is a sinus headache, have advised daily antihistamine and twice daily guaifenesin.  Time spent: 46 minutes reviewing chart, interviewing and examining patient and formulating plan of care.     Lelon Frohlich, MD [facility]

## 2022-03-10 NOTE — Addendum Note (Signed)
Addended by: Westley Hummer B on: 03/10/2022 04:47 PM   Modules accepted: Orders

## 2022-04-07 ENCOUNTER — Encounter: Payer: Self-pay | Admitting: Internal Medicine

## 2022-05-21 ENCOUNTER — Other Ambulatory Visit: Payer: Self-pay | Admitting: Internal Medicine

## 2022-05-21 DIAGNOSIS — Z1231 Encounter for screening mammogram for malignant neoplasm of breast: Secondary | ICD-10-CM

## 2022-06-09 ENCOUNTER — Other Ambulatory Visit (INDEPENDENT_AMBULATORY_CARE_PROVIDER_SITE_OTHER): Payer: Medicare Other

## 2022-06-09 DIAGNOSIS — E782 Mixed hyperlipidemia: Secondary | ICD-10-CM

## 2022-06-09 LAB — LIPID PANEL
Cholesterol: 214 mg/dL — ABNORMAL HIGH (ref 0–200)
HDL: 65 mg/dL (ref >39.00)
LDL Cholesterol: 133 mg/dL — ABNORMAL HIGH (ref 0–99)
NonHDL: 149.22
Total CHOL/HDL Ratio: 3
Triglycerides: 81 mg/dL (ref 0.0–149.0)
VLDL: 16.2 mg/dL (ref 0.0–40.0)

## 2022-06-30 ENCOUNTER — Ambulatory Visit
Admission: RE | Admit: 2022-06-30 | Discharge: 2022-06-30 | Disposition: A | Discharge status: Home / Self Care | Payer: Medicare Other | Source: Ambulatory Visit | Attending: Internal Medicine | Admitting: Internal Medicine

## 2022-06-30 DIAGNOSIS — Z1231 Encounter for screening mammogram for malignant neoplasm of breast: Secondary | ICD-10-CM

## 2022-07-02 ENCOUNTER — Encounter: Payer: Self-pay | Admitting: Adult Health

## 2022-07-02 ENCOUNTER — Ambulatory Visit (INDEPENDENT_AMBULATORY_CARE_PROVIDER_SITE_OTHER): Payer: Medicare Other | Admitting: Adult Health

## 2022-07-02 VITALS — BP 120/72 | HR 60 | Temp 97.7°F | Ht 67.0 in | Wt 174.0 lb

## 2022-07-02 DIAGNOSIS — R058 Other specified cough: Secondary | ICD-10-CM

## 2022-07-02 MED ORDER — ROSUVASTATIN CALCIUM 5 MG PO TABS
5.0000 mg | ORAL_TABLET | ORAL | 0 refills | Status: DC
Start: 2022-07-02 — End: 2022-11-04

## 2022-07-02 MED ORDER — PREDNISONE 10 MG PO TABS: ORAL_TABLET | ORAL | 0 refills | Status: DC

## 2022-07-02 MED ORDER — HYDROCODONE BIT-HOMATROP MBR 5-1.5 MG/5ML PO SOLN: 5.0000 mL | Freq: Three times a day (TID) | ORAL | 0 refills | Status: DC | PRN

## 2022-07-02 NOTE — Progress Notes (Signed)
Subjective:    Patient ID: Karla Moore, female    DOB: 20-Jun-1951, 71 y.o.   MRN: [id number]  HPI 71 year old female who  has a past medical history of Allergy, Anxiety, GERD (gastroesophageal reflux disease), adenomatous polyp of colon (08/03/2019), and Hyperlipidemia.  She is a patient of Dr.Hernandez who I am seeing today for an acute issue. She reports having a productive cough x 2 weeks. When her  symptoms presented she had a sore throat and runny nose but these have since resolved. She did test for COVID at home and her results were negative.   Denies fevers, chills, shortness of breath, or wheezing.   At home she has tried using robatussin, delsym, tylenol, zyrtec, and mucinex without relief. At night she has ben using Nyquil so she can get some sleep   Review of Systems See HPI   Past Medical History:  Diagnosis Date   Allergy    spring    Anxiety    GERD (gastroesophageal reflux disease)    Hx of adenomatous polyp of colon 08/03/2019   Hyperlipidemia     Social History   Socioeconomic History   Marital status: Married    Spouse name: Not on file   Number of children: Not on file   Years of education: Not on file   Highest education level: Bachelor's degree (e.g., BA, AB, BS)  Occupational History   Not on file  Tobacco Use   Smoking status: Never   Smokeless tobacco: Never  Substance and Sexual Activity   Alcohol use: Yes    Comment: socially    Drug use: Never   Sexual activity: Not on file  Other Topics Concern   Not on file  Social History Narrative   Not on file   Social Determinants of Health   Financial Resource Strain: Low Risk  (07/01/2022)   Overall Financial Resource Strain (CARDIA)    Difficulty of Paying Living Expenses: Not hard at all  Food Insecurity: No Food Insecurity (07/01/2022)   Hunger Vital Sign    Worried About Running Out of Food in the Last Year: Never true    Ran Out of Food in the Last Year: Never true   Transportation Needs: No Transportation Needs (07/01/2022)   PRAPARE - Hydrologist (Medical): No    Lack of Transportation (Non-Medical): No  Physical Activity: Sufficiently Active (07/01/2022)   Exercise Vital Sign    Days of Exercise per Week: 3 days    Minutes of Exercise per Session: 50 min  Stress: No Stress Concern Present (07/01/2022)   [place]    Feeling of Stress : Not at all  Social Connections: Moderately Integrated (07/01/2022)   Social Connection and Isolation Panel [NHANES]    Frequency of Communication with Friends and Family: More than three times a week    Frequency of Social Gatherings with Friends and Family: Once a week    Attends Religious Services: More than 4 times per year    Active Member of [employer] or Organizations: No    Attends Archivist Meetings: Not on file    Marital Status: Married  Intimate Partner Violence: Not on file    Past Surgical History:  Procedure Laterality Date   COLONOSCOPY     COLONOSCOPY WITH PROPOFOL N/A 07/31/2019   Procedure: COLONOSCOPY WITH PROPOFOL;  Surgeon: Gatha Mayer, MD;  Location: [facility];  Service: Endoscopy;  Laterality: N/A;   DILATION AND CURETTAGE OF UTERUS     x 2 per pt   POLYPECTOMY  07/31/2019   Procedure: POLYPECTOMY;  Surgeon: Gatha Mayer, MD;  Location: [facility];  Service: Endoscopy;;   TONSILLECTOMY     WISDOM TOOTH EXTRACTION      Family History  Problem Relation Age of Onset   Colon cancer Mother 29       80's   Lung cancer Brother    Lung cancer Brother    Breast cancer Neg Hx    Colon polyps Neg Hx    Esophageal cancer Neg Hx    Rectal cancer Neg Hx    Stomach cancer Neg Hx     Allergies  Allergen Reactions   Ibuprofen Other (See Comments)    Pt stated, "It makes me feel like my heart is expanding; gave me pain"    Current Outpatient Medications on File Prior to Visit   Medication Sig Dispense Refill   Acetaminophen (TYLENOL 8 HOUR ARTHRITIS PAIN PO) Take by mouth.     acetaminophen (TYLENOL) 500 MG tablet Take 500 mg by mouth every 6 (six) hours as needed (Cold).      ALPRAZolam (XANAX) 0.25 MG tablet Take 0.25 mg by mouth as needed for anxiety.      cholecalciferol (VITAMIN D) 1000 units tablet Take 2,000 Units by mouth daily.     ferrous sulfate (FEROSUL) 325 (65 FE) MG tablet Take 325 mg by mouth daily with breakfast. Three times a week     Magnesium 250 MG TABS Take 500 mg by mouth daily. Do not take on the weekends     Multiple Vitamin (MULTIVITAMIN) tablet Take 1 tablet by mouth daily. Do no take on the Weekend     omeprazole (PRILOSEC) 20 MG capsule Take 20 mg by mouth every other day.     rosuvastatin (CRESTOR) 5 MG tablet Take 5 mg by mouth. Take one tablet every other day     vitamin E 200 UNIT capsule Take 200 Units by mouth daily. Do not take on Weekends     No current facility-administered medications on file prior to visit.    BP 120/72 (BP Location: Left Arm, Patient Position: Sitting, Cuff Size: Normal) Comment: home  Pulse 60   Temp 97.7 F (36.5 C) (Oral)   Ht '5\' 7"'$  (1.702 m)   Wt 174 lb (78.9 kg)   SpO2 98%   BMI 27.25 kg/m       Objective:   Physical Exam Vitals and nursing note reviewed.  Constitutional:      Appearance: Normal appearance.  Cardiovascular:     Rate and Rhythm: Normal rate and regular rhythm.     Pulses: Normal pulses.     Heart sounds: Normal heart sounds.  Pulmonary:     Effort: Pulmonary effort is normal.     Breath sounds: Normal breath sounds.     Comments: Continuous dry cough noted in the office Skin:    General: Skin is warm.  Neurological:     General: No focal deficit present.     Mental Status: She is alert and oriented to person, place, and time.  Psychiatric:        Mood and Affect: Mood normal.        Behavior: Behavior normal.        Thought Content: Thought content normal.         Judgment: Judgment normal.  Assessment & Plan:  1. Post-viral cough syndrome  - predniSONE (DELTASONE) 10 MG tablet; 40 mg x 3 days, 20 mg x 3 days, 10 mg x 3 days  Dispense: 21 tablet; Refill: 0 - HYDROcodone bit-homatropine (HYCODAN) 5-1.5 MG/5ML syrup; Take 5 mLs by mouth every 8 (eight) hours as needed for cough.  Dispense: 120 mL; Refill: 0 - She was informed that cough syrup may make her drowsy   Dorothyann Peng, NP

## 2022-07-07 DIAGNOSIS — H0102B Squamous blepharitis left eye, upper and lower eyelids: Secondary | ICD-10-CM | POA: Diagnosis not present

## 2022-07-07 DIAGNOSIS — H02831 Dermatochalasis of right upper eyelid: Secondary | ICD-10-CM | POA: Diagnosis not present

## 2022-07-07 DIAGNOSIS — H40013 Open angle with borderline findings, low risk, bilateral: Secondary | ICD-10-CM | POA: Diagnosis not present

## 2022-07-07 DIAGNOSIS — H02834 Dermatochalasis of left upper eyelid: Secondary | ICD-10-CM | POA: Diagnosis not present

## 2022-07-07 DIAGNOSIS — D3131 Benign neoplasm of right choroid: Secondary | ICD-10-CM | POA: Diagnosis not present

## 2022-07-07 DIAGNOSIS — H2513 Age-related nuclear cataract, bilateral: Secondary | ICD-10-CM | POA: Diagnosis not present

## 2022-07-22 ENCOUNTER — Encounter: Payer: Self-pay | Admitting: Internal Medicine

## 2022-08-27 ENCOUNTER — Other Ambulatory Visit: Payer: Self-pay | Admitting: Adult Health

## 2022-09-08 ENCOUNTER — Encounter: Payer: Self-pay | Admitting: Internal Medicine

## 2022-09-08 ENCOUNTER — Ambulatory Visit (INDEPENDENT_AMBULATORY_CARE_PROVIDER_SITE_OTHER): Payer: Medicare Other | Admitting: Internal Medicine

## 2022-09-08 VITALS — BP 127/71 | Temp 97.6°F | Ht 67.0 in | Wt 175.7 lb

## 2022-09-08 DIAGNOSIS — Z0000 Encounter for general adult medical examination without abnormal findings: Secondary | ICD-10-CM | POA: Diagnosis not present

## 2022-09-08 DIAGNOSIS — Z23 Encounter for immunization: Secondary | ICD-10-CM | POA: Diagnosis not present

## 2022-09-08 DIAGNOSIS — E782 Mixed hyperlipidemia: Secondary | ICD-10-CM

## 2022-09-08 DIAGNOSIS — R7302 Impaired glucose tolerance (oral): Secondary | ICD-10-CM | POA: Diagnosis not present

## 2022-09-08 DIAGNOSIS — Z13820 Encounter for screening for osteoporosis: Secondary | ICD-10-CM | POA: Diagnosis not present

## 2022-09-08 LAB — CBC WITH DIFFERENTIAL/PLATELET
Basophils Absolute: 0.1 10*3/uL (ref 0.0–0.1)
Basophils Relative: 1.4 % (ref 0.0–3.0)
Eosinophils Absolute: 0.1 10*3/uL (ref 0.0–0.7)
Eosinophils Relative: 1.4 % (ref 0.0–5.0)
HCT: 41.6 % (ref 36.0–46.0)
Hemoglobin: 13.9 g/dL (ref 12.0–15.0)
Lymphocytes Relative: 52.3 % — ABNORMAL HIGH (ref 12.0–46.0)
Lymphs Abs: 3 10*3/uL (ref 0.7–4.0)
MCHC: 33.4 g/dL (ref 30.0–36.0)
MCV: 84.9 fl (ref 78.0–100.0)
Monocytes Absolute: 0.5 10*3/uL (ref 0.1–1.0)
Monocytes Relative: 9.2 % (ref 3.0–12.0)
Neutro Abs: 2 10*3/uL (ref 1.4–7.7)
Neutrophils Relative %: 35.7 % — ABNORMAL LOW (ref 43.0–77.0)
Platelets: 276 10*3/uL (ref 150.0–400.0)
RBC: 4.9 Mil/uL (ref 3.87–5.11)
RDW: 13.9 % (ref 11.5–15.5)
WBC: 5.7 10*3/uL (ref 4.0–10.5)

## 2022-09-08 LAB — COMPREHENSIVE METABOLIC PANEL
ALT: 24 U/L (ref 0–35)
AST: 39 U/L — ABNORMAL HIGH (ref 0–37)
Albumin: 4.6 g/dL (ref 3.5–5.2)
Alkaline Phosphatase: 83 U/L (ref 39–117)
BUN: 12 mg/dL (ref 6–23)
CO2: 30 mEq/L (ref 19–32)
Calcium: 10 mg/dL (ref 8.4–10.5)
Chloride: 102 mEq/L (ref 96–112)
Creatinine, Ser: 0.92 mg/dL (ref 0.40–1.20)
GFR: 62.87 mL/min (ref >60.00)
Glucose, Bld: 96 mg/dL (ref 70–99)
Potassium: 3.9 mEq/L (ref 3.5–5.1)
Sodium: 140 mEq/L (ref 135–145)
Total Bilirubin: 0.6 mg/dL (ref 0.2–1.2)
Total Protein: 7.9 g/dL (ref 6.0–8.3)

## 2022-09-08 LAB — LIPID PANEL
Cholesterol: 214 mg/dL — ABNORMAL HIGH (ref 0–200)
HDL: 72.2 mg/dL (ref >39.00)
LDL Cholesterol: 125 mg/dL — ABNORMAL HIGH (ref 0–99)
NonHDL: 141.63
Total CHOL/HDL Ratio: 3
Triglycerides: 83 mg/dL (ref 0.0–149.0)
VLDL: 16.6 mg/dL (ref 0.0–40.0)

## 2022-09-08 LAB — HEMOGLOBIN A1C: Hgb A1c MFr Bld: 6.5 % (ref 4.6–6.5)

## 2022-09-08 LAB — VITAMIN D 25 HYDROXY (VIT D DEFICIENCY, FRACTURES): VITD: 59.26 ng/mL (ref 30.00–100.00)

## 2022-09-08 NOTE — Patient Instructions (Signed)
-  Nice seeing you today!!  -Lab work today; will notify you once results are available.  -Pneumonia vaccine today.  -Remember RSV vaccine at pharmacy.  -See you back in 6 months.

## 2022-09-08 NOTE — Addendum Note (Signed)
Addended by: Westley Hummer B on: 09/08/2022 09:52 AM   Modules accepted: Orders

## 2022-09-08 NOTE — Progress Notes (Signed)
Established Patient Office Visit     CC/Reason for Visit: Annual preventive exam and subsequent Medicare wellness visit  HPI: Karla Moore is a 71 y.o. female who is coming in today for the above mentioned reasons. Past Medical History is significant for: Impaired glucose tolerance and hyperlipidemia.  She is on rosuvastatin 5 mg but only tolerates taking it twice a week.  When she has tried increasing to even 3 days a week she has severe myalgias.  She has routine eye and dental care, no perceived hearing issues, she exercises by walking 3 days a week and line dancing 3 days a week.  She is due for PCV 20 and RSV vaccines.  She is overdue for bone density.  She declines Pap smears due to age.   Past Medical/Surgical History: Past Medical History:  Diagnosis Date   Allergy    spring    Anxiety    GERD (gastroesophageal reflux disease)    Hx of adenomatous polyp of colon 08/03/2019   Hyperlipidemia     Past Surgical History:  Procedure Laterality Date   COLONOSCOPY     COLONOSCOPY WITH PROPOFOL N/A 07/31/2019   Procedure: COLONOSCOPY WITH PROPOFOL;  Surgeon: Gatha Mayer, MD;  Location: [facility];  Service: Endoscopy;  Laterality: N/A;   DILATION AND CURETTAGE OF UTERUS     x 2 per pt   POLYPECTOMY  07/31/2019   Procedure: POLYPECTOMY;  Surgeon: Gatha Mayer, MD;  Location: [facility];  Service: Endoscopy;;   TONSILLECTOMY     WISDOM TOOTH EXTRACTION      Social History:  reports that she has never smoked. She has never used smokeless tobacco. She reports current alcohol use. She reports that she does not use drugs.  Allergies: Allergies  Allergen Reactions   Ibuprofen Other (See Comments)    Pt stated, "It makes me feel like my heart is expanding; gave me pain"    Family History:  Family History  Problem Relation Age of Onset   Colon cancer Mother 69       80's   Lung cancer Brother    Lung cancer Brother    Breast cancer Neg Hx    Colon  polyps Neg Hx    Esophageal cancer Neg Hx    Rectal cancer Neg Hx    Stomach cancer Neg Hx      Current Outpatient Medications:    Acetaminophen (TYLENOL 8 HOUR ARTHRITIS PAIN PO), Take by mouth., Disp: , Rfl:    acetaminophen (TYLENOL) 500 MG tablet, Take 500 mg by mouth every 6 (six) hours as needed (Cold). , Disp: , Rfl:    ALPRAZolam (XANAX) 0.25 MG tablet, Take 0.25 mg by mouth as needed for anxiety. , Disp: , Rfl:    cholecalciferol (VITAMIN D) 1000 units tablet, Take 2,000 Units by mouth daily., Disp: , Rfl:    ferrous sulfate (FEROSUL) 325 (65 FE) MG tablet, Take 325 mg by mouth daily with breakfast. Three times a week, Disp: , Rfl:    Magnesium 250 MG TABS, Take 500 mg by mouth daily. Do not take on the weekends, Disp: , Rfl:    Multiple Vitamin (MULTIVITAMIN) tablet, Take 1 tablet by mouth daily. Do no take on the Weekend, Disp: , Rfl:    omeprazole (PRILOSEC) 20 MG capsule, Take 20 mg by mouth every other day., Disp: , Rfl:    rosuvastatin (CRESTOR) 5 MG tablet, Take 1 tablet (5 mg total) by mouth every other  day. Take one tablet every other dayTake 5 mg by mouth. Take one tablet every other day, Disp: 45 tablet, Rfl: 0   vitamin E 200 UNIT capsule, Take 200 Units by mouth daily. Do not take on Weekends, Disp: , Rfl:   Review of Systems:  Constitutional: Denies fever, chills, diaphoresis, appetite change and fatigue.  HEENT: Denies photophobia, eye pain, redness, hearing loss, ear pain, congestion, sore throat, rhinorrhea, sneezing, mouth sores, trouble swallowing, neck pain, neck stiffness and tinnitus.   Respiratory: Denies SOB, DOE, cough, chest tightness,  and wheezing.   Cardiovascular: Denies chest pain, palpitations and leg swelling.  Gastrointestinal: Denies nausea, vomiting, abdominal pain, diarrhea, constipation, blood in stool and abdominal distention.  Genitourinary: Denies dysuria, urgency, frequency, hematuria, flank pain and difficulty urinating.  Endocrine:  Denies: hot or cold intolerance, sweats, changes in hair or nails, polyuria, polydipsia. Musculoskeletal: Denies myalgias, back pain, joint swelling, arthralgias and gait problem.  Skin: Denies pallor, rash and wound.  Neurological: Denies dizziness, seizures, syncope, weakness, light-headedness, numbness and headaches.  Hematological: Denies adenopathy. Easy bruising, personal or family bleeding history  Psychiatric/Behavioral: Denies suicidal ideation, mood changes, confusion, nervousness, sleep disturbance and agitation    Physical Exam: Vitals:   09/08/22 0912 09/08/22 0916 09/08/22 0933  BP: (!) 150/100 (!) 170/92 127/71  Temp: 97.6 F (36.4 C)    TempSrc: Oral    Weight: 175 lb 11.2 oz (79.7 kg)    Height: '5\' 7"'$  (1.702 m)      Body mass index is 27.52 kg/m.   Constitutional: NAD, calm, comfortable Eyes: PERRL, lids and conjunctivae normal, wears corrective lenses ENMT: Mucous membranes are moist. Posterior pharynx clear of any exudate or lesions. Normal dentition. Tympanic membrane is pearly white, no erythema or bulging. Neck: normal, supple, no masses, no thyromegaly Respiratory: clear to auscultation bilaterally, no wheezing, no crackles. Normal respiratory effort. No accessory muscle use.  Cardiovascular: Regular rate and rhythm, no murmurs / rubs / gallops. No extremity edema. 2+ pedal pulses. No carotid bruits.  Abdomen: no tenderness, no masses palpated. No hepatosplenomegaly. Bowel sounds positive.  Musculoskeletal: no clubbing / cyanosis. No joint deformity upper and lower extremities. Good ROM, no contractures. Normal muscle tone.  Skin: no rashes, lesions, ulcers. No induration Neurologic: CN 2-12 grossly intact. Sensation intact, DTR normal. Strength 5/5 in all 4.  Psychiatric: Normal judgment and insight. Alert and oriented x 3. Normal mood.   Subsequent Medicare wellness visit   1. Risk factors, based on past  M,S,F -cardiovascular disease risk factors  include age, hyperlipidemia   2.  Physical activities: Very physically active   3.  Depression/mood: Stable, not depressed   4.  Hearing: No perceived issues   5.  ADL's: Independent in all ADLs   6.  Fall risk: Low fall risk   7.  Home safety: No problems identified   8.  Height weight, and visual acuity: height and weight as above, vision:  Vision Screening   Right eye Left eye Both eyes  Without correction     With correction '20/20 20/20 20/20 '$     9.  Counseling: Advised to update age-appropriate immunizations   10. Lab orders based on risk factors: Laboratory update will be reviewed   11. Referral : DEXA   12. Care plan: Follow-up with me in 6 months   13. Cognitive assessment: No cognitive impairment   14. Screening: Patient provided with a written and personalized 5-10 year screening schedule in the AVS. yes   15.  Provider List Update: PCP only  16. Advance Directives: Full code   17. Opioids: Patient is not on any opioid prescriptions and has no risk factors for a substance use disorder.   [place] Office Visit from 09/08/2022 in [place] at [place]  PHQ-9 Total Score 3          07/01/2022    4:24 PM 09/08/2022    9:11 AM  [place] in the past year? 0 0  Was there an injury with Fall?  0  Fall Risk Category Calculator  0  Fall Risk Category  Low  Patient Fall Risk Level  Low fall risk  Patient at Risk for Falls Due to  No Fall Risks  Fall risk Follow up  Falls evaluation completed      Impression and Plan:  Encounter for preventive health examination  Screening for osteoporosis - Plan: DG Bone Density  IGT (impaired glucose tolerance) - Plan: Hemoglobin A1c  Mixed hyperlipidemia - Plan: CBC with Differential/Platelet, Comprehensive metabolic panel, Lipid panel, VITAMIN D 25 Hydroxy (Vit-D Deficiency, Fractures)  Need for vaccination against Streptococcus pneumoniae  -Recommend routine eye and dental  care. -Immunizations: PCV 20 in office today, advised to get RSV at pharmacy, all other immunizations including flu and COVID are up-to-date -Healthy lifestyle discussed in detail. -Labs to be updated today. -Colon cancer screening: 07/2019, 10-year follow-up -Breast cancer screening: 06/2022 -Cervical cancer screening: Declines due to age -Lung cancer screening: Not applicable -Prostate cancer screening: Not applicable -DEXA: Requested  -Despite elevated blood pressure in office today, home records show that blood pressure has been normal, suspect an element of whitecoat syndrome.      Patient Instructions  -Nice seeing you today!!  -Lab work today; will notify you once results are available.  -Pneumonia vaccine today.  -Remember RSV vaccine at pharmacy.  -See you back in 6 months.      Lelon Frohlich, MD [facility] at [facility]

## 2022-09-10 ENCOUNTER — Other Ambulatory Visit: Payer: Self-pay | Admitting: Internal Medicine

## 2022-09-10 ENCOUNTER — Encounter: Payer: Self-pay | Admitting: Internal Medicine

## 2022-09-10 DIAGNOSIS — E119 Type 2 diabetes mellitus without complications: Secondary | ICD-10-CM | POA: Insufficient documentation

## 2022-09-10 DIAGNOSIS — Z789 Other specified health status: Secondary | ICD-10-CM

## 2022-09-10 DIAGNOSIS — E1169 Type 2 diabetes mellitus with other specified complication: Secondary | ICD-10-CM

## 2022-09-16 ENCOUNTER — Telehealth: Payer: Self-pay | Admitting: Internal Medicine

## 2022-09-16 NOTE — Telephone Encounter (Signed)
Pt has a questions about lab work  the  Neutrophils Relative and Lymphocytes Relative

## 2022-09-17 NOTE — Telephone Encounter (Signed)
Patient is aware 

## 2022-09-17 NOTE — Telephone Encounter (Signed)
Pt is having fatigue and is wondering that could be the reason her  Neutrophils Relative and Lymphocytes Relative  is low

## 2022-09-17 NOTE — Telephone Encounter (Signed)
Left message on machine for patient to return our call. What questions does she have?

## 2022-10-26 DIAGNOSIS — H43811 Vitreous degeneration, right eye: Secondary | ICD-10-CM | POA: Diagnosis not present

## 2022-11-04 ENCOUNTER — Telehealth: Payer: Self-pay | Admitting: Internal Medicine

## 2022-11-04 MED ORDER — ROSUVASTATIN CALCIUM 5 MG PO TABS: 5.0000 mg | ORAL_TABLET | ORAL | 0 refills | Status: DC

## 2022-11-04 NOTE — Telephone Encounter (Signed)
Prescription Request  11/04/2022  Is this a "Controlled Substance" medicine? No  LOV: 09/08/2022  What is the name of the medication or equipment? rosuvastatin (CRESTOR) 5 MG tablet (Expired)   Have you contacted your pharmacy to request a refill? No   Which pharmacy would you like this sent to?   [facility], [address] [place] [address] Phone: [phone] Fax: [phone]    Patient notified that their request is being sent to the clinical staff for review and that they should receive a response within 2 business days.   Please advise at Mobile [phone] (mobile)

## 2022-11-13 ENCOUNTER — Encounter: Payer: Self-pay | Admitting: Internal Medicine

## 2023-02-25 DIAGNOSIS — D225 Melanocytic nevi of trunk: Secondary | ICD-10-CM | POA: Diagnosis not present

## 2023-02-25 DIAGNOSIS — L821 Other seborrheic keratosis: Secondary | ICD-10-CM | POA: Diagnosis not present

## 2023-02-25 DIAGNOSIS — L810 Postinflammatory hyperpigmentation: Secondary | ICD-10-CM | POA: Diagnosis not present

## 2023-02-25 DIAGNOSIS — L814 Other melanin hyperpigmentation: Secondary | ICD-10-CM | POA: Diagnosis not present

## 2023-02-25 DIAGNOSIS — L815 Leukoderma, not elsewhere classified: Secondary | ICD-10-CM | POA: Diagnosis not present

## 2023-03-01 ENCOUNTER — Encounter: Payer: Self-pay | Admitting: Internal Medicine

## 2023-03-01 ENCOUNTER — Ambulatory Visit
Admission: RE | Admit: 2023-03-01 | Discharge: 2023-03-01 | Disposition: A | Discharge status: Home / Self Care | Payer: Medicare Other | Source: Ambulatory Visit | Attending: Internal Medicine | Admitting: Internal Medicine

## 2023-03-01 DIAGNOSIS — E349 Endocrine disorder, unspecified: Secondary | ICD-10-CM | POA: Diagnosis not present

## 2023-03-01 DIAGNOSIS — M8580 Other specified disorders of bone density and structure, unspecified site: Secondary | ICD-10-CM | POA: Insufficient documentation

## 2023-03-01 DIAGNOSIS — Z13820 Encounter for screening for osteoporosis: Secondary | ICD-10-CM

## 2023-03-01 DIAGNOSIS — M810 Age-related osteoporosis without current pathological fracture: Secondary | ICD-10-CM | POA: Diagnosis not present

## 2023-03-02 ENCOUNTER — Ambulatory Visit (HOSPITAL_BASED_OUTPATIENT_CLINIC_OR_DEPARTMENT_OTHER): Payer: Medicare Other | Admitting: Internal Medicine

## 2023-03-02 ENCOUNTER — Encounter (HOSPITAL_BASED_OUTPATIENT_CLINIC_OR_DEPARTMENT_OTHER): Payer: Self-pay | Admitting: Internal Medicine

## 2023-03-02 VITALS — BP 171/79 | HR 55 | Ht 67.0 in | Wt 179.8 lb

## 2023-03-02 DIAGNOSIS — E785 Hyperlipidemia, unspecified: Secondary | ICD-10-CM | POA: Diagnosis not present

## 2023-03-02 DIAGNOSIS — E1169 Type 2 diabetes mellitus with other specified complication: Secondary | ICD-10-CM | POA: Diagnosis not present

## 2023-03-02 DIAGNOSIS — T466X5A Adverse effect of antihyperlipidemic and antiarteriosclerotic drugs, initial encounter: Secondary | ICD-10-CM

## 2023-03-02 DIAGNOSIS — M791 Myalgia, unspecified site: Secondary | ICD-10-CM

## 2023-03-02 MED ORDER — EZETIMIBE 10 MG PO TABS
10.0000 mg | ORAL_TABLET | Freq: Every day | ORAL | 3 refills | Status: DC
Start: 2023-03-02 — End: 2023-10-14

## 2023-03-02 NOTE — Patient Instructions (Addendum)
Medication Instructions:  Your physician has recommended you make the following change in your medication:   Start: Zetia 10mg  daily  *If you need a refill on your cardiac medications before your next appointment, please call your pharmacy*   Lab Work: Please return for Lab work in 3-4 months for Lipid Panel, NMR, and Lpa . You may come to the...   [facility] (3rd floor) [address] [place], [place] [address]  Open: 8am-Noon and 1pm-4:30pm  Please ring the doorbell on the small table when you exit the elevator and the Lab Tech will come get you  [facility] at [facility] [address], [place], [place] [address] Open: 8am-1pm, then 2pm-4:30pm   [facility]- Please see attached locations sheet stapled to your lab work with address and hours.   If you have labs (blood work) drawn today and your tests are completely normal, you will receive your results only by: MyChart Message (if you have MyChart) OR A paper copy in the mail If you have any lab test that is abnormal or we need to change your treatment, we will call you to review the results.  Follow-Up: At [facility], you and your health needs are our priority.  As part of our continuing mission to provide you with exceptional heart care, we have created designated Provider Care Teams.  These Care Teams include your primary Cardiologist (physician) and Advanced Practice Providers (APPs -  Physician Assistants and Nurse Practitioners) who all work together to provide you with the care you need, when you need it.  We recommend signing up for the patient portal called "MyChart".  Sign up information is provided on this After Visit Summary.  MyChart is used to connect with patients for Virtual Visits (Telemedicine).  Patients are able to view lab/test results, encounter notes, upcoming appointments, etc.  Non-urgent messages can be sent to your provider as well.   To learn more  about what you can do with MyChart, go to [email or web address].    Your next appointment:   4-6 month(s)  Provider:   Dr. Rennis Golden in [facility]

## 2023-03-02 NOTE — Progress Notes (Signed)
LIPID CLINIC CONSULT NOTE  Chief Complaint:  Managed dyslipidemia  Primary Care Physician: Philip Aspen, Limmie Patricia, MD  Primary Cardiologist:  None  HPI:  Karla Moore is a 72 y.o. female who is being seen today for the evaluation of dyslipidemia at the request of Philip Aspen, Almira Bar*. This is a pleasant 72 year old female, referred for evaluation management of dyslipidemia.  She has a history of statin intolerance having tried atorvastatin, rosuvastatin and other statins in the past but more recently has been tolerating very low-dose rosuvastatin 5 mg twice a week.  She had previously had myalgias which were significant.  She was recently diagnosed with type 2 diabetes based on hemoglobin A1c of 6.5 although has had some dietary discretions recently.  She said she continues to struggle with fatigue of unknown etiology and has started drinking Pepsi on a regular basis and eating chocolate to help with her fatigue.  The chocolate actually was added because she was having issues with constipation but then is also been having some indigestion and been using antacids for that.  I suspect that the chocolate also worsens her indigestion.  Despite this she has a dyslipidemia and technically as a diabetic hide her target LDL is less than 70.  Most recent lipids in December showed total cholesterol 214, HDL 72, triglycerides 83 and LDL 125.  She has no known cardiovascular disease.  It does not sound like there is a significant history of heart disease in the family.  Primarily, cancers in the family.  PMHx:  Past Medical History:  Diagnosis Date   Allergy    spring    Anxiety    GERD (gastroesophageal reflux disease)    Hx of adenomatous polyp of colon 08/03/2019   Hyperlipidemia     Past Surgical History:  Procedure Laterality Date   COLONOSCOPY     COLONOSCOPY WITH PROPOFOL N/A 07/31/2019   Procedure: COLONOSCOPY WITH PROPOFOL;  Surgeon: Iva Boop, MD;  Location: [facility]  ENDOSCOPY;  Service: Endoscopy;  Laterality: N/A;   DILATION AND CURETTAGE OF UTERUS     x 2 per pt   POLYPECTOMY  07/31/2019   Procedure: POLYPECTOMY;  Surgeon: Iva Boop, MD;  Location: [facility] ENDOSCOPY;  Service: Endoscopy;;   TONSILLECTOMY     WISDOM TOOTH EXTRACTION      FAMHx:  Family History  Problem Relation Age of Onset   Colon cancer Mother 51       32's   Lung cancer Brother    Lung cancer Brother    Breast cancer Neg Hx    Colon polyps Neg Hx    Esophageal cancer Neg Hx    Rectal cancer Neg Hx    Stomach cancer Neg Hx     SOCHx:   reports that she has never smoked. She has never used smokeless tobacco. She reports current alcohol use. She reports that she does not use drugs.  ALLERGIES:  Allergies  Allergen Reactions   Ibuprofen Other (See Comments)    Pt stated, "It makes me feel like my heart is expanding; gave me pain"    ROS: Pertinent items noted in HPI and remainder of comprehensive ROS otherwise negative.  HOME MEDS: Current Outpatient Medications on File Prior to Visit  Medication Sig Dispense Refill   Acetaminophen (TYLENOL 8 HOUR ARTHRITIS PAIN PO) Take by mouth.     acetaminophen (TYLENOL) 500 MG tablet Take 500 mg by mouth every 6 (six) hours as needed (Cold).  ALPRAZolam (XANAX) 0.25 MG tablet Take 0.25 mg by mouth as needed for anxiety.      cholecalciferol (VITAMIN D) 1000 units tablet Take 2,000 Units by mouth daily.     ferrous sulfate (FEROSUL) 325 (65 FE) MG tablet Take 325 mg by mouth daily with breakfast. Three times a week     Magnesium 250 MG TABS Take 500 mg by mouth daily. Do not take on the weekends     Multiple Vitamin (MULTIVITAMIN) tablet Take 1 tablet by mouth daily. Do no take on the Weekend     omeprazole (PRILOSEC) 20 MG capsule Take 20 mg by mouth as needed.     rosuvastatin (CRESTOR) 5 MG tablet Take 5 mg by mouth 2 (two) times a week.     vitamin E 200 UNIT capsule Take 200 Units by mouth daily. Do not take on  Weekends     No current facility-administered medications on file prior to visit.    LABS/IMAGING: No results found for this or any previous visit (from the past 48 hour(s)). DG Bone Density  Result Date: 03/01/2023 EXAM: DUAL X-RAY ABSORPTIOMETRY (DXA) FOR BONE MINERAL DENSITY IMPRESSION: Patient: Knaak, Orbie Pyo Referring Physician: Micael Hampshire ACOSTA Birth Date: 1950/10/30 [age] Patient ID: [id number] Height: 67.0 in. Weight: 177.6 lbs. Measured: 03/01/2023 2:30:22 PM (16 SP 2) Sex: Female Ethnicity: Black Analyzed: 03/01/2023 2:38:24 PM (16 SP 2) AP Spine Bone Density Densitometry: [place] (Combined NHANES/Lunar) Region BMD     YA      AM (g/cm2) T-score Z-score L1         0.992 -1.1 -0.1 L2         1.023 -1.5 -0.5 L3         1.109 -0.8 0.2 L4         0.971 -1.9 -0.9 L1-L4 (L3) 0.993 -1.5 -0.5 Densitometry Trend: L1-L4 (L3) Measured Date Age BMD Change vs Previous (g/cm2) Change vs Previous (%) (years) (g/cm2) 03/01/2023 [age] 0.993   -0.039 * -3.8 * 04/15/2020 [age] 1.032   -        - Patient: Cartmell, Orbie Pyo Referring Physician: Micael Hampshire ACOSTA Birth Date: 12/19/1950 [age]:       [age] Patient ID: [id number] Height: 67.0 in. Weight: 177.6 lbs. Measured: 03/01/2023 2:30:22 PM (16 SP 2) Sex: Female Ethnicity: Black Analyzed: 03/01/2023 2:38:24 PM (16 SP 2) ANCILLARY RESULTS: AP Spine Region     BMD     YA  YA      AM  AM      [facility]   Area  Width Height (g/cm2) (%) T-score (%) Z-score (g)   (cm2) (cm)  (cm) L1         0.992   88  -1.1    99  -0.1    11.15 11.24 3.9   2.90 L2         1.023   85  -1.5    95  -0.5    12.86 12.57 4.2   3.01 L3         1.109   92  -0.8    103 0.2     19.75 17.81 4.4   4.01 L4         0.971   81  -1.9    90  -0.9    15.23 15.70 4.8   3.26 L1-L2      1.009   87  -1.3    97  -  0.3    24.01 23.80 4.0   5.91 L1-L4 (L3) 0.993   85  -1.5    95  -0.5    39.24 39.50 4.3   9.17 L2-L4 (L3) 0.994   83  -1.7    92  -0.7    28.09 28.26 4.5   6.26 Patient:  Futrell, Orbie Pyo Referring Physician: Micael Hampshire ACOSTA Birth Date: 17-Dec-1950 [age] Patient ID: [id number] Height: 67.0 in. Weight: 177.6 lbs. Measured: 03/01/2023 2:30:22 PM (16 SP 2) Sex: Female Ethnicity: Black Analyzed: 03/01/2023 2:38:24 PM (16 SP 2) DualFemur Bone Density Densitometry: [place] (Combined NHANES/Lunar) Densitometry Trend: Total Mean Region                                    Measured Date BMD                                       Age (g/cm2)                                   (years) YA                                        BMD T-score                                   (g/cm2) AM                                        Change vs Previous (g/cm2) Z-score                                   Change vs Previous (%) Neck Left                                 03/01/2023 0.824                                     71.4 -1.5                                      0.877 -0.7 Neck Right                                0.027 0.804 -1.7                                      3.2 -0.8 Neck Mean  04/15/2020 0.814                                     68.5 -1.6                                      0.850 -0.8 Neck Diff.                                - 0.021 0.1                                       - 0.1 Total Left 0.889 -0.9 -0.4 Total Right 0.865 -1.1 -0.6 Total Mean 0.877 -1.0 -0.5 Total Diff. 0.024 0.2 0.2 Densitometry: [place] (Combined NHANES/Lunar) Region BMD     YA      AM (g/cm2) T-score Z-score Neck Left   0.824 -1.5 -0.7 Neck Right  0.804 -1.7 -0.8 Neck Mean   0.814 -1.6 -0.8 Neck Diff.  0.021 0.1  0.1 Total Left  0.889 -0.9 -0.4 Total Right 0.865 -1.1 -0.6 Total Mean  0.877 -1.0 -0.5 Total Diff. 0.024 0.2  0.2 Densitometry Trend: Total Mean Measured Date Age BMD Change vs Previous (g/cm2) Change vs Previous (%) (years) (g/cm2) 03/01/2023 71.4 0.877   0.027   3.2 04/15/2020 68.5 0.850   -       - Referring Physician:  Henderson Cloud Your patient completed  a bone mineral density test using GE Lunar iDXA system (analysis version: 16). Technologist: HPJ PATIENT: Name: Kaytelynn, Bainbridge Patient ID: [id number] Birth Date: [birth year] Height: 67.0 in. Sex: Female Measured: 03/01/2023 Weight: 177.6 lbs. Indications: Advanced Age, Estrogen Deficient, Omeprazole, Postmenopausal, Secondary Osteoporosis Fractures: None Treatments: Multivitamin, Vitamin D (E933.5) ASSESSMENT: The BMD measured at Femur Neck Right is 0.804 g/cm2 with a T-score of -1.7. This This patient's diagnostic category is LOW BONE MASS/OSTEOPENIA mass according to World Health Organization [facility]) criteria. The quality of the exam is good. L3 was excluded due to degenerative changes. Site Region Measured Date Measured Age YA BMD Significant CHANGE T-score AP Spine L1-L4 (L3) 03/01/2023 71.4 -1.5 0.993 g/cm2 * AP Spine  L1-L4 (L3) 04/15/2020    68.5         -1.2    1.032 g/cm2 DualFemur Neck Right 03/01/2023    71.4         -1.7    0.804 g/cm2 DualFemur Neck Right 04/15/2020    68.5         -1.5    0.830 g/cm2 DualFemur Total Mean 03/01/2023    71.4         -1.0    0.877 g/cm2 DualFemur Total Mean 04/15/2020    68.5         -1.2    0.850 g/cm2 [facility]) criteria for post-menopausal, Caucasian Women: Normal       T-score at or above -1 SD Osteopenia   T-score between -1 and -2.5 SD Osteoporosis T-score at or below -2.5 SD RECOMMENDATION: 1. All patients should optimize calcium and vitamin D intake. 2. Consider FDA-approved medical therapies in postmenopausal women and men aged 53 years and older, based on the following: a. A hip or  vertebral (clinical or morphometric) fracture. b. T-score = -2.5 at the femoral neck or spine after appropriate evaluation to exclude secondary causes. c. Low bone mass (T-score between -1.0 and -2.5 at the femoral neck or spine) and a 10-year probability of a hip fracture = 3% or a 10-year probability of a major osteoporosis-related fracture = 20% based on the  [place]-adapted WHO algorithm. d. Clinician judgment and/or patient preferences may indicate treatment for people with 10-year fracture probabilities above or below these levels. FOLLOW-UP: Patients with diagnosis of osteoporosis or at high risk for fracture should have regular bone mineral density tests.? Patients eligible for Medicare are allowed routine testing every 2 years.? The testing frequency can be increased to one year for patients who have rapidly progressing disease, are receiving or discontinuing medical therapy to restore bone mass, or have additional risk factors. I have reviewed this study and agree with the findings. [facility] FRAX* 10-year Probability of Fracture Based on femoral neck BMD: DualFemur (Right) Major Osteoporotic Fracture: 4.8% Hip Fracture:                0.8% Population:                  [place] (Black) Risk Factors:                Secondary Osteoporosis *FRAX is a Armed forces logistics/support/administrative officer of the [employer] of [employer] for Metabolic Bone Disease, a World Science writer (WHO) [employer]. ASSESSMENT: The probability of a major osteoporotic fracture is 4.8% within the next ten years. The probability of a hip fracture is 0.8% within the next ten years. Electronically Signed   By: Harmon Pier M.D.   On: 03/01/2023 15:01    LIPID PANEL:    Component Value Date/Time   CHOL 214 (H) 09/08/2022 0946   TRIG 83.0 09/08/2022 0946   HDL 72.20 09/08/2022 0946   CHOLHDL 3 09/08/2022 0946   VLDL 16.6 09/08/2022 0946   LDLCALC 125 (H) 09/08/2022 0946    WEIGHTS: Wt Readings from Last 3 Encounters:  03/02/23 179 lb 12.8 oz (81.6 kg)  09/08/22 175 lb 11.2 oz (79.7 kg)  07/02/22 174 lb (78.9 kg)    VITALS: BP (!) 171/79 (BP Location: Right Arm, Patient Position: Sitting, Cuff Size: Normal)   Pulse (!) 55   Ht 5\' 7"  (1.702 m)   Wt 179 lb 12.8 oz (81.6 kg)   SpO2 100%   BMI 28.16 kg/m   EXAM: Deferred  EKG: Deferred  ASSESSMENT: Mixed  dyslipidemia, goal LDL less than 70 Type 2 diabetes-A1c 6.5%, diet controlled Statin intolerance-myalgias  PLAN: 1.   Karla Moore has a mixed dyslipidemia with a target LDL less than 70.  She can only tolerate rosuvastatin 5 mg weekly.  She needs additional lipid lowering and would likely benefit from combination of improved diet as well as lifestyle changes.  We discussed diet to some degree today but I would advise adding ezetimibe as well.  We discussed possible side effects which may be GI with this medication and should be considered given her ongoing issues with constipation and reflux.  Nonetheless, if tolerated will plan repeat lipids in about 3 months and follow-up afterwards.  She should continue rosuvastatin in addition to this.  Thanks again for the kind referral.  Chrystie Nose, MD, [facility]  [place]  [facility]  Medical Director of the [facility]  [facility] Diplomate of the American  Board of Clinical Lipidology Attending Cardiologist  Direct Dial: [phone]  Fax: [phone]  Website:  [email or web address] Nicely Jurell Basista 03/02/2023, 11:30 AM

## 2023-03-08 ENCOUNTER — Other Ambulatory Visit: Payer: Self-pay | Admitting: *Deleted

## 2023-03-08 MED ORDER — ROSUVASTATIN CALCIUM 5 MG PO TABS: 5.0000 mg | ORAL_TABLET | ORAL | 1 refills | Status: DC

## 2023-03-16 ENCOUNTER — Ambulatory Visit (INDEPENDENT_AMBULATORY_CARE_PROVIDER_SITE_OTHER): Payer: Medicare Other | Admitting: Internal Medicine

## 2023-03-16 ENCOUNTER — Encounter: Payer: Self-pay | Admitting: Internal Medicine

## 2023-03-16 VITALS — BP 150/80 | HR 75 | Temp 98.1°F | Wt 180.2 lb

## 2023-03-16 DIAGNOSIS — R6883 Chills (without fever): Secondary | ICD-10-CM | POA: Diagnosis not present

## 2023-03-16 DIAGNOSIS — J069 Acute upper respiratory infection, unspecified: Secondary | ICD-10-CM

## 2023-03-16 DIAGNOSIS — H8309 Labyrinthitis, unspecified ear: Secondary | ICD-10-CM | POA: Diagnosis not present

## 2023-03-16 DIAGNOSIS — R5383 Other fatigue: Secondary | ICD-10-CM

## 2023-03-16 LAB — POC COVID19 BINAXNOW: SARS Coronavirus 2 Ag: NEGATIVE

## 2023-03-16 MED ORDER — PREDNISONE 10 MG (21) PO TBPK
ORAL_TABLET | ORAL | 0 refills | Status: DC
Start: 2023-03-16 — End: 2023-03-24

## 2023-03-16 NOTE — Progress Notes (Signed)
Established Patient Office Visit     CC/Reason for Visit: URI symptoms, dizziness  HPI: Karla Moore is a 72 y.o. female who is coming in today for the above mentioned reasons. Past Medical History is significant for: Was at a wedding 2 weeks ago.  3 days ago woke up with severe dizziness, vertigo like room spinning around her.  She has also been having runny nose, congestion.  She has experienced pain over her left forehead.  Vertigo is somewhat helped with Dramamine.   Past Medical/Surgical History: Past Medical History:  Diagnosis Date   Allergy    spring    Anxiety    GERD (gastroesophageal reflux disease)    Hx of adenomatous polyp of colon 08/03/2019   Hyperlipidemia     Past Surgical History:  Procedure Laterality Date   COLONOSCOPY     COLONOSCOPY WITH PROPOFOL N/A 07/31/2019   Procedure: COLONOSCOPY WITH PROPOFOL;  Surgeon: Iva Boop, MD;  Location: [facility] END[facility];  Service: Endoscopy;  Laterality: N/A;   DILATION AND CURETTAGE OF UTERUS     x 2 per pt   POLYPECTOMY  07/31/2019   Procedure: POLYPECTOMY;  Surgeon: Iva Boop, MD;  Location: [facility] END[facility];  Service: Endoscopy;;   TONSILLECTOMY     WISDOM TOOTH EXTRACTION      Social History:  reports that she has never smoked. She has never used smokeless tobacco. She reports current alcohol use. She reports that she does not use drugs.  Allergies: Allergies  Allergen Reactions   Ibuprofen Other (See Comments)    Pt stated, "It makes me feel like my heart is expanding; gave me pain"    Family History:  Family History  Problem Relation Age of Onset   Colon cancer Mother 73       15's   Lung cancer Brother    Lung cancer Brother    Breast cancer Neg Hx    Colon polyps Neg Hx    Esophageal cancer Neg Hx    Rectal cancer Neg Hx    Stomach cancer Neg Hx      Current Outpatient Medications:    Acetaminophen (TYLENOL 8 HOUR ARTHRITIS PAIN PO), Take by mouth., Disp: , Rfl:     acetaminophen (TYLENOL) 500 MG tablet, Take 500 mg by mouth every 6 (six) hours as needed (Cold). , Disp: , Rfl:    ALPRAZolam (XANAX) 0.25 MG tablet, Take 0.25 mg by mouth as needed for anxiety. , Disp: , Rfl:    cholecalciferol (VITAMIN D) 1000 units tablet, Take 2,000 Units by mouth daily., Disp: , Rfl:    ezetimibe (ZETIA) 10 MG tablet, Take 1 tablet (10 mg total) by mouth daily., Disp: 90 tablet, Rfl: 3   ferrous sulfate (FEROSUL) 325 (65 FE) MG tablet, Take 325 mg by mouth daily with breakfast. Three times a week, Disp: , Rfl:    Magnesium 250 MG TABS, Take 500 mg by mouth daily. Do not take on the weekends, Disp: , Rfl:    Multiple Vitamin (MULTIVITAMIN) tablet, Take 1 tablet by mouth daily. Do no take on the Weekend, Disp: , Rfl:    omeprazole (PRILOSEC) 20 MG capsule, Take 20 mg by mouth as needed., Disp: , Rfl:    predniSONE (STERAPRED UNI-PAK 21 TAB) 10 MG (21) TBPK tablet, Take as directed, Disp: 21 tablet, Rfl: 0   rosuvastatin (CRESTOR) 5 MG tablet, Take 1 tablet (5 mg total) by mouth 2 (two) times a week., Disp: 60 tablet,  Rfl: 1   vitamin E 200 UNIT capsule, Take 200 Units by mouth daily. Do not take on Weekends, Disp: , Rfl:   Review of Systems:  Negative unless indicated in HPI.   Physical Exam: Vitals:   03/16/23 1555  BP: (!) 150/80  Pulse: 75  Temp: 98.1 F (36.7 C)  TempSrc: Oral  SpO2: 99%  Weight: 180 lb 3.2 oz (81.7 kg)    Body mass index is 28.22 kg/m.   Physical Exam Vitals reviewed.  Constitutional:      Appearance: Normal appearance.  HENT:     Right Ear: Tympanic membrane, ear canal and external ear normal.     Left Ear: Tympanic membrane, ear canal and external ear normal.     Mouth/Throat:     Mouth: Mucous membranes are moist.     Pharynx: Posterior oropharyngeal erythema present.  Eyes:     Conjunctiva/sclera: Conjunctivae normal.     Pupils: Pupils are equal, round, and reactive to light.  Cardiovascular:     Rate and Rhythm: Normal  rate and regular rhythm.  Pulmonary:     Effort: Pulmonary effort is normal.     Breath sounds: Normal breath sounds.  Neurological:     Mental Status: She is alert.      Impression and Plan:  Viral URI  Other fatigue -     POC COVID-19 BinaxNow  Chills -     POC COVID-19 BinaxNow  Labyrinthitis, unspecified laterality -     predniSONE; Take as directed  Dispense: 21 tablet; Refill: 0   -In office COVID test is negative. -Given exam findings, PNA, pharyngitis, ear infection are not likely, hence abx have not been prescribed. -Have advised rest, fluids, OTC antihistamines, cough suppressants and mucinex. -RTC if no improvement in 10-14 days. -Suspect vertigo related to acute labyrinthitis, will treat with a round of prednisone.  Time spent:22 minutes reviewing chart, interviewing and examining patient and formulating plan of care.     Chaya Jan, MD [facility]

## 2023-03-24 ENCOUNTER — Ambulatory Visit (INDEPENDENT_AMBULATORY_CARE_PROVIDER_SITE_OTHER): Payer: Medicare Other | Admitting: Family Medicine

## 2023-03-24 ENCOUNTER — Encounter: Payer: Self-pay | Admitting: Family Medicine

## 2023-03-24 VITALS — BP 128/78 | HR 81 | Temp 98.1°F | Wt 179.0 lb

## 2023-03-24 DIAGNOSIS — J0110 Acute frontal sinusitis, unspecified: Secondary | ICD-10-CM

## 2023-03-24 DIAGNOSIS — R5383 Other fatigue: Secondary | ICD-10-CM

## 2023-03-24 MED ORDER — AZITHROMYCIN 250 MG PO TABS
ORAL_TABLET | ORAL | 0 refills | Status: DC
Start: 2023-03-24 — End: 2023-08-03

## 2023-03-24 NOTE — Progress Notes (Signed)
   Subjective:    Patient ID: Karla Moore, female    DOB: 21-Sep-1951, 72 y.o.   MRN: [id number]  HPI Here for several weeks of mild headache, pressure in the forehead and around the eyes, PND, and dizziness. She describes feeling like the room is spinning around her, especially if she moves quickly. No fever or cough. She was seen here on 03-16-23, and it was felt she had a viral URI which caused a labyrinthitis. She was given a Prednisone taper, but this did not help at all. She has mild nausea at times, but has not vomited.    Review of Systems  Constitutional:  Positive for fatigue. Negative for fever.  HENT:  Positive for congestion, postnasal drip and sinus pressure. Negative for ear pain, hearing loss and sore throat.   Eyes: Negative.   Respiratory: Negative.    Neurological:  Positive for dizziness and headaches.       Objective:   Physical Exam Constitutional:      Appearance: Normal appearance. She is not ill-appearing.  HENT:     Right Ear: Tympanic membrane, ear canal and external ear normal.     Left Ear: Tympanic membrane, ear canal and external ear normal.     Nose: Nose normal.     Mouth/Throat:     Pharynx: Oropharynx is clear.  Eyes:     Conjunctiva/sclera: Conjunctivae normal.  Cardiovascular:     Rate and Rhythm: Normal rate and regular rhythm.     Pulses: Normal pulses.     Heart sounds: Normal heart sounds.  Pulmonary:     Effort: Pulmonary effort is normal.     Breath sounds: Normal breath sounds.  Lymphadenopathy:     Cervical: No cervical adenopathy.  Neurological:     Mental Status: She is alert.           Assessment & Plan:  She seems to be dealing with an underlying sinus infection that has affected her vestibular system. Treat with a Zpack. Recheck as needed. Drink plenty of fluids.  Gershon Crane, MD

## 2023-03-25 LAB — TSH: TSH: 1.46 u[IU]/mL (ref 0.35–5.50)

## 2023-03-25 LAB — VITAMIN B12: Vitamin B-12: 570 pg/mL (ref 211–911)

## 2023-05-21 ENCOUNTER — Other Ambulatory Visit: Payer: Self-pay | Admitting: Internal Medicine

## 2023-05-21 DIAGNOSIS — Z1231 Encounter for screening mammogram for malignant neoplasm of breast: Secondary | ICD-10-CM

## 2023-07-06 ENCOUNTER — Ambulatory Visit
Admission: RE | Admit: 2023-07-06 | Discharge: 2023-07-06 | Disposition: A | Discharge status: Home / Self Care | Payer: Medicare Other | Source: Ambulatory Visit | Attending: Internal Medicine | Admitting: Internal Medicine

## 2023-07-06 DIAGNOSIS — Z1231 Encounter for screening mammogram for malignant neoplasm of breast: Secondary | ICD-10-CM | POA: Diagnosis not present

## 2023-07-12 ENCOUNTER — Other Ambulatory Visit: Payer: Self-pay | Admitting: Internal Medicine

## 2023-07-12 DIAGNOSIS — R928 Other abnormal and inconclusive findings on diagnostic imaging of breast: Secondary | ICD-10-CM

## 2023-07-13 DIAGNOSIS — H02831 Dermatochalasis of right upper eyelid: Secondary | ICD-10-CM | POA: Diagnosis not present

## 2023-07-13 DIAGNOSIS — H40013 Open angle with borderline findings, low risk, bilateral: Secondary | ICD-10-CM | POA: Diagnosis not present

## 2023-07-13 DIAGNOSIS — H524 Presbyopia: Secondary | ICD-10-CM | POA: Diagnosis not present

## 2023-07-13 DIAGNOSIS — D3131 Benign neoplasm of right choroid: Secondary | ICD-10-CM | POA: Diagnosis not present

## 2023-07-13 DIAGNOSIS — H2513 Age-related nuclear cataract, bilateral: Secondary | ICD-10-CM | POA: Diagnosis not present

## 2023-07-13 DIAGNOSIS — H43811 Vitreous degeneration, right eye: Secondary | ICD-10-CM | POA: Diagnosis not present

## 2023-07-13 DIAGNOSIS — H02834 Dermatochalasis of left upper eyelid: Secondary | ICD-10-CM | POA: Diagnosis not present

## 2023-07-13 LAB — HM DIABETES EYE EXAM

## 2023-07-21 ENCOUNTER — Ambulatory Visit
Admission: RE | Admit: 2023-07-21 | Discharge: 2023-07-21 | Disposition: A | Discharge status: Home / Self Care | Payer: Medicare Other | Source: Ambulatory Visit | Attending: Internal Medicine | Admitting: Internal Medicine

## 2023-07-21 DIAGNOSIS — R928 Other abnormal and inconclusive findings on diagnostic imaging of breast: Secondary | ICD-10-CM

## 2023-07-23 IMAGING — MG MM DIGITAL DIAGNOSTIC UNILAT*L* W/ TOMO W/ CAD
6 series · 6 of 18 positions shown · non-contrast
Comparison: Previous exams.

CLINICAL DATA: 70-year-old female with focal pain involving the
outer left breast with a surrounding burning sensation and possible
palpable area of concern.

EXAM:
DIGITAL DIAGNOSTIC UNILATERAL LEFT MAMMOGRAM WITH TOMOSYNTHESIS AND
CAD; ULTRASOUND LEFT BREAST LIMITED
TECHNIQUE: Left digital diagnostic mammography and breast tomosynthesis was
performed. The images were evaluated with computer-aided detection.;
Targeted ultrasound examination of the left breast was performed.

[L TAN synth-2D]
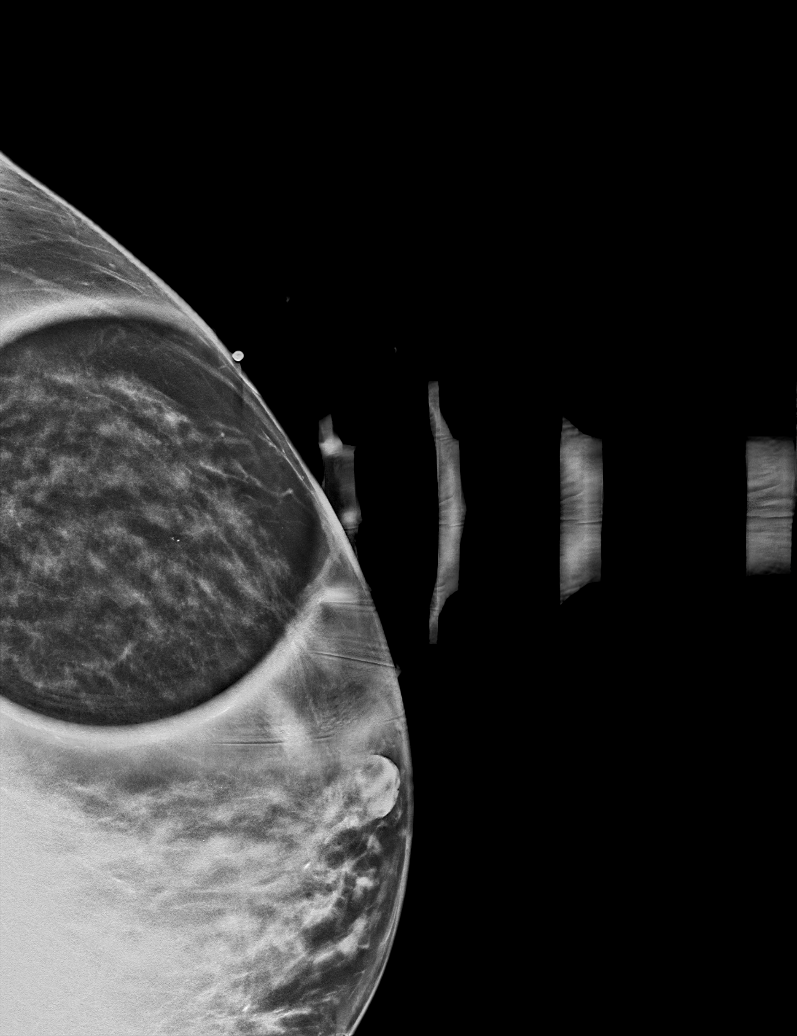

[L MLO synth-2D]
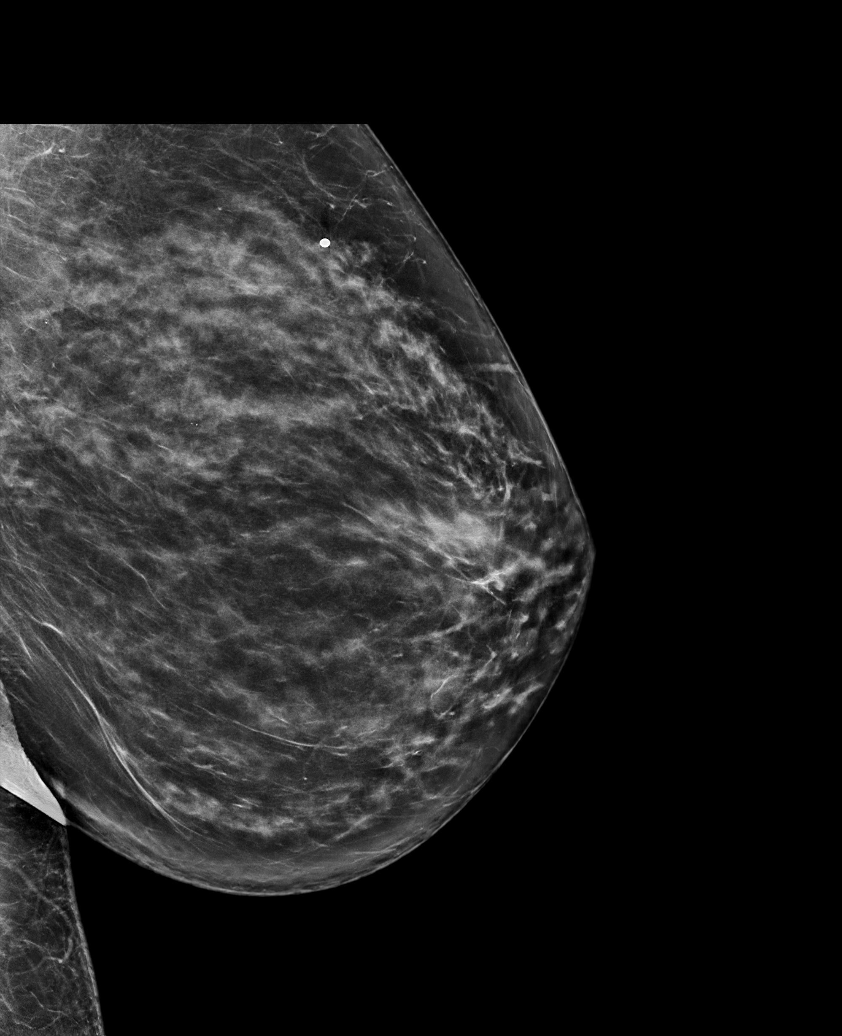

[L CC synth-2D]
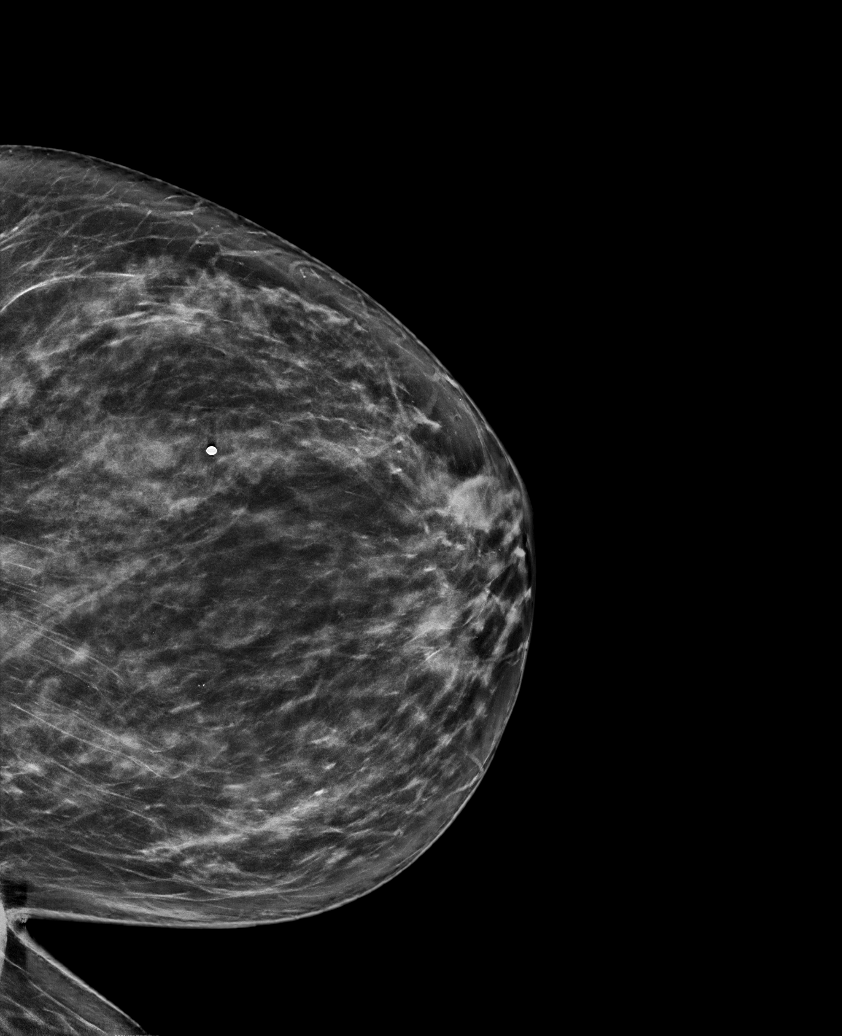

[L MLO tomo · tomo slice 43/84.0]
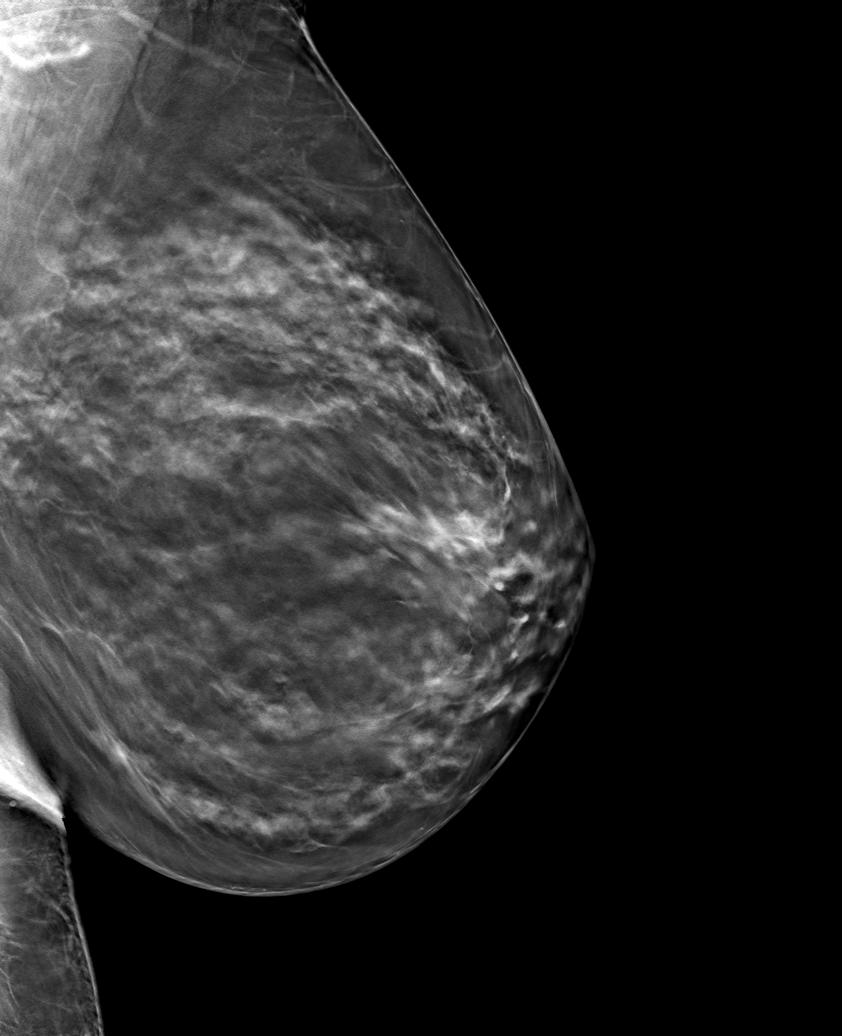

[L TAN tomo · tomo slice 31/62.0]
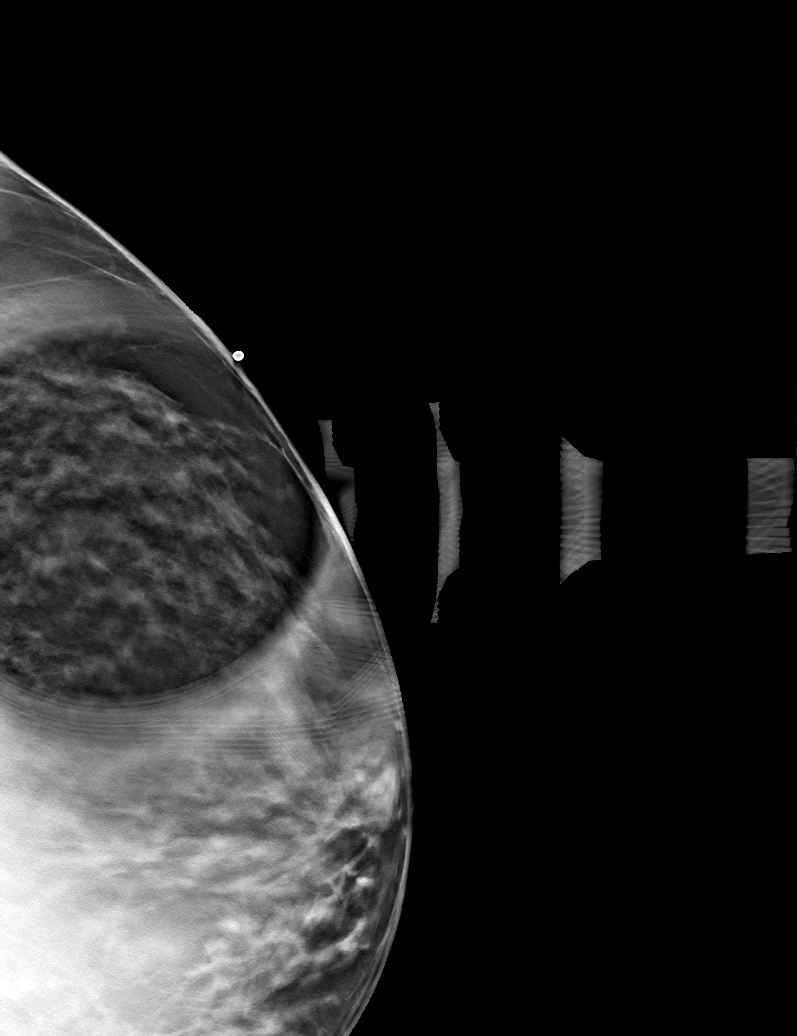

[L CC tomo · tomo slice 41/82.0]
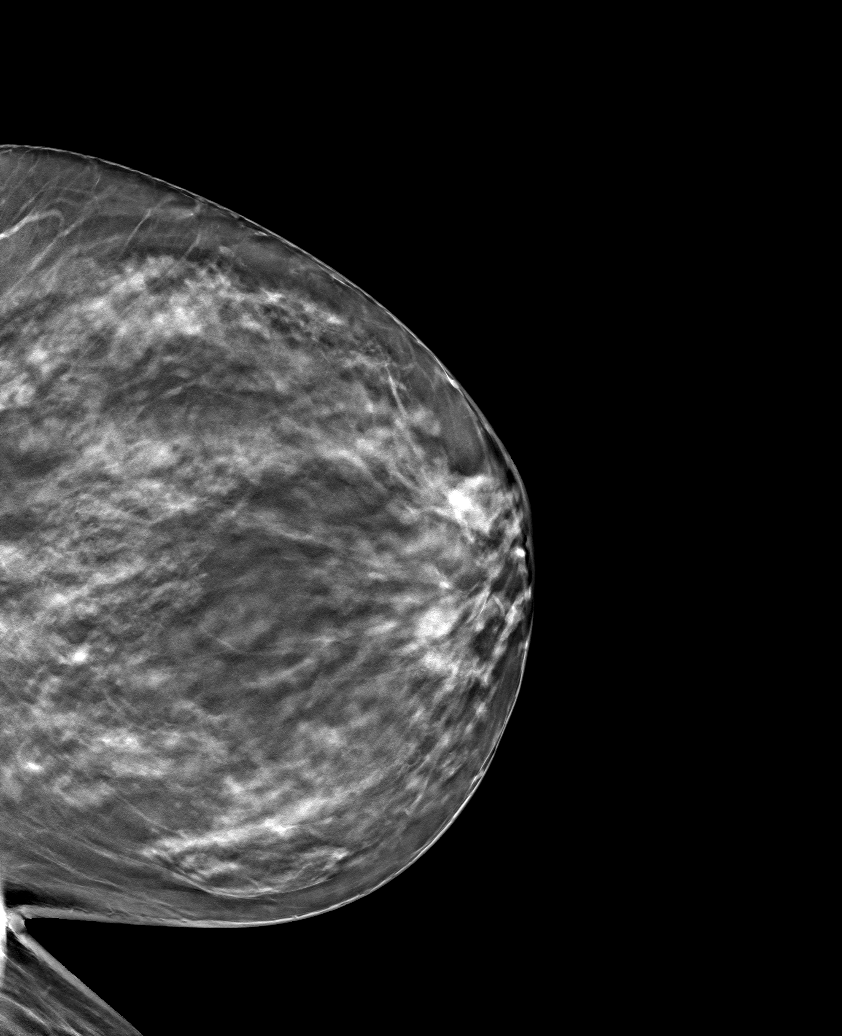

[6 of 18 positions shown; findings below may reference images not displayed]

ACR Breast Density Category c: The breast tissue is heterogeneously
dense, which may obscure small masses.
FINDINGS: No suspicious masses or calcifications seen in the left breast. Spot
compression tomograms were performed over the area of concern in the
left breast with no definite abnormality seen.

Physical examination of the upper left breast reveals generalized
thickening without an underlying discrete palpable mass.

Targeted ultrasound of the left breast was performed. No suspicious
masses or abnormality seen, only heterogeneous fibroglandular tissue
identified.
IMPRESSION: 1. No mammographic or sonographic abnormalities at site of concern
in the left breast.

2.  No mammographic evidence of malignancy in the left breast.

RECOMMENDATION:
1. Recommend further management of left breast tenderness/palpable
area of concern be based on clinical assessment.

2.  Recommend annual screening mammography, due May 2022.

I have discussed the findings and recommendations with the patient.
If applicable, a reminder letter will be sent to the patient
regarding the next appointment.

BI-RADS CATEGORY  1: Negative.

## 2023-07-27 DIAGNOSIS — E1169 Type 2 diabetes mellitus with other specified complication: Secondary | ICD-10-CM | POA: Diagnosis not present

## 2023-07-27 DIAGNOSIS — E785 Hyperlipidemia, unspecified: Secondary | ICD-10-CM | POA: Diagnosis not present

## 2023-07-28 LAB — LIPID PANEL
Chol/HDL Ratio: 2.6 ratio (ref 0.0–4.4)
Cholesterol, Total: 204 mg/dL — ABNORMAL HIGH (ref 100–199)
HDL: 77 mg/dL (ref >39)
LDL Chol Calc (NIH): 114 mg/dL — ABNORMAL HIGH (ref 0–99)
Triglycerides: 73 mg/dL (ref 0–149)
VLDL Cholesterol Cal: 13 mg/dL (ref 5–40)

## 2023-07-28 LAB — NMR, LIPOPROFILE
Cholesterol, Total: 198 mg/dL (ref 100–199)
HDL Particle Number: 39.1 umol/L (ref >=30.5)
HDL-C: 82 mg/dL (ref >39)
LDL Particle Number: 1033 nmol/L — ABNORMAL HIGH (ref <1000)
LDL Size: 21.4 nmol (ref >20.5)
LDL-C (NIH Calc): 104 mg/dL — ABNORMAL HIGH (ref 0–99)
LP-IR Score: <25 (ref <=45)
Small LDL Particle Number: <90 nmol/L (ref <=527)
Triglycerides: 66 mg/dL (ref 0–149)

## 2023-07-28 LAB — LIPOPROTEIN A (LPA): Lipoprotein (a): 283.3 nmol/L — ABNORMAL HIGH (ref <75.0)

## 2023-08-03 ENCOUNTER — Telehealth (HOSPITAL_BASED_OUTPATIENT_CLINIC_OR_DEPARTMENT_OTHER): Payer: Self-pay | Admitting: Pharmacy Technician

## 2023-08-03 ENCOUNTER — Encounter (HOSPITAL_BASED_OUTPATIENT_CLINIC_OR_DEPARTMENT_OTHER): Payer: Self-pay | Admitting: Internal Medicine

## 2023-08-03 ENCOUNTER — Ambulatory Visit (HOSPITAL_BASED_OUTPATIENT_CLINIC_OR_DEPARTMENT_OTHER): Payer: Medicare Other | Admitting: Internal Medicine

## 2023-08-03 ENCOUNTER — Other Ambulatory Visit (HOSPITAL_COMMUNITY): Payer: Self-pay

## 2023-08-03 VITALS — BP 160/80 | HR 76 | Ht 67.0 in | Wt 182.0 lb

## 2023-08-03 DIAGNOSIS — E785 Hyperlipidemia, unspecified: Secondary | ICD-10-CM

## 2023-08-03 DIAGNOSIS — M791 Myalgia, unspecified site: Secondary | ICD-10-CM | POA: Diagnosis not present

## 2023-08-03 DIAGNOSIS — E1169 Type 2 diabetes mellitus with other specified complication: Secondary | ICD-10-CM

## 2023-08-03 DIAGNOSIS — T466X5D Adverse effect of antihyperlipidemic and antiarteriosclerotic drugs, subsequent encounter: Secondary | ICD-10-CM

## 2023-08-03 DIAGNOSIS — E7841 Elevated Lipoprotein(a): Secondary | ICD-10-CM | POA: Diagnosis not present

## 2023-08-03 NOTE — Progress Notes (Signed)
LIPID CLINIC CONSULT NOTE  Chief Complaint:  Managed dyslipidemia  Primary Care Physician: Philip Aspen, Limmie Patricia, MD  Primary Cardiologist:  None  HPI:  Karla Moore is a 72 y.o. female who is being seen today for the evaluation of dyslipidemia at the request of Philip Aspen, Almira Bar*. This is a pleasant 72 year old female, referred for evaluation management of dyslipidemia.  She has a history of statin intolerance having tried atorvastatin, rosuvastatin and other statins in the past but more recently has been tolerating very low-dose rosuvastatin 5 mg twice a week.  She had previously had myalgias which were significant.  She was recently diagnosed with type 2 diabetes based on hemoglobin A1c of 6.5 although has had some dietary discretions recently.  She said she continues to struggle with fatigue of unknown etiology and has started drinking Pepsi on a regular basis and eating chocolate to help with her fatigue.  The chocolate actually was added because she was having issues with constipation but then is also been having some indigestion and been using antacids for that.  I suspect that the chocolate also worsens her indigestion.  Despite this she has a dyslipidemia and technically as a diabetic hide her target LDL is less than 70.  Most recent lipids in December showed total cholesterol 214, HDL 72, triglycerides 83 and LDL 125.  She has no known cardiovascular disease.  It does not sound like there is a significant history of heart disease in the family.  Primarily, cancers in the family.  08/03/2023  Ms. Fanning returns today for follow-up.  She has had some mild improvement in her lipids however cholesterol remains elevated.  Her LDL particle number is 1033 with an LDL of 104, HDL 82 and triglycerides 66.  Her LP(a) however is elevated at 283.3 nmol/L.  We discussed the consequences of this today.  In addition I mentioned that standard therapy such as the statins do not  lower LP(a) rather she would likely benefit from PCSK9 inhibitor therapy.  This is in addition to the fact that her cholesterol remains above target LDL less than 70.  Although she was noted to be recently diagnosed with type 2 diabetes by her PCP, she says that her A1c now is lower and that she should not have been given that diagnosis.  PMHx:  Past Medical History:  Diagnosis Date   Allergy    spring    Anxiety    GERD (gastroesophageal reflux disease)    Hx of adenomatous polyp of colon 08/03/2019   Hyperlipidemia     Past Surgical History:  Procedure Laterality Date   COLONOSCOPY     COLONOSCOPY WITH PROPOFOL N/A 07/31/2019   Procedure: COLONOSCOPY WITH PROPOFOL;  Surgeon: Iva Boop, MD;  Location: [facility];  Service: Endoscopy;  Laterality: N/A;   DILATION AND CURETTAGE OF UTERUS     x 2 per pt   POLYPECTOMY  07/31/2019   Procedure: POLYPECTOMY;  Surgeon: Iva Boop, MD;  Location: [facility];  Service: Endoscopy;;   TONSILLECTOMY     WISDOM TOOTH EXTRACTION      FAMHx:  Family History  Problem Relation Age of Onset   Colon cancer Mother 85       65's   Lung cancer Brother    Lung cancer Brother    Breast cancer Neg Hx    Colon polyps Neg Hx    Esophageal cancer Neg Hx    Rectal cancer Neg Hx    Stomach  cancer Neg Hx     SOCHx:   reports that she has never smoked. She has never used smokeless tobacco. She reports current alcohol use. She reports that she does not use drugs.  ALLERGIES:  Allergies  Allergen Reactions   Ibuprofen Other (See Comments)    Pt stated, "It makes me feel like my heart is expanding; gave me pain"    ROS: Pertinent items noted in HPI and remainder of comprehensive ROS otherwise negative.  HOME MEDS: Current Outpatient Medications on File Prior to Visit  Medication Sig Dispense Refill   Acetaminophen (TYLENOL 8 HOUR ARTHRITIS PAIN PO) Take by mouth.     acetaminophen (TYLENOL) 500 MG tablet Take 500 mg by mouth every 6  (six) hours as needed (Cold).      ALPRAZolam (XANAX) 0.25 MG tablet Take 0.25 mg by mouth as needed for anxiety.      cholecalciferol (VITAMIN D) 1000 units tablet Take 2,000 Units by mouth daily.     ezetimibe (ZETIA) 10 MG tablet Take 1 tablet (10 mg total) by mouth daily. 90 tablet 3   ferrous sulfate (FEROSUL) 325 (65 FE) MG tablet Take 325 mg by mouth daily with breakfast. Three times a week     Magnesium 250 MG TABS Take 500 mg by mouth daily. Do not take on the weekends     Multiple Vitamin (MULTIVITAMIN) tablet Take 1 tablet by mouth daily. Do no take on the Weekend     omeprazole (PRILOSEC) 20 MG capsule Take 20 mg by mouth as needed.     rosuvastatin (CRESTOR) 5 MG tablet Take 1 tablet (5 mg total) by mouth 2 (two) times a week. 60 tablet 1   vitamin E 200 UNIT capsule Take 200 Units by mouth daily. Do not take on Weekends     No current facility-administered medications on file prior to visit.    LABS/IMAGING: No results found for this or any previous visit (from the past 48 hour(s)). No results found.  LIPID PANEL:    Component Value Date/Time   CHOL 204 (H) 07/27/2023 0855   TRIG 73 07/27/2023 0855   HDL 77 07/27/2023 0855   CHOLHDL 2.6 07/27/2023 0855   CHOLHDL 3 09/08/2022 0946   VLDL 16.6 09/08/2022 0946   LDLCALC 114 (H) 07/27/2023 0855    WEIGHTS: Wt Readings from Last 3 Encounters:  08/03/23 182 lb (82.6 kg)  03/24/23 179 lb (81.2 kg)  03/16/23 180 lb 3.2 oz (81.7 kg)    VITALS: BP (!) 160/80 (BP Location: Left Arm, Patient Position: Sitting, Cuff Size: Normal)   Pulse 76   Ht 5\' 7"  (1.702 m)   Wt 182 lb (82.6 kg)   SpO2 96%   BMI 28.51 kg/m   EXAM: Deferred  EKG: Deferred  ASSESSMENT: Mixed dyslipidemia, goal LDL less than 70 Type 2 diabetes-A1c 6.5%, diet controlled Statin intolerance-myalgias 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   Ms. Bartolo has not achieved a target LDL less than 70.  Moreover her LP(a) is significantly elevated.  I  think she is a good candidate to add PCSK9 inhibitor therapy to her current regimen.  Unfortunately she has had some myalgias on Crestor and only takes the 5 mg twice weekly.  The same is with ezetimibe where she takes it only twice a week.  Given this infrequent dosing, is not surprising her cholesterol remains high.  Plan prior authorization and follow-up with a repeat NMR and LP(a) in about 4 months.  Lisette Abu  Rennis Golden, MD, [facility], FACP  [place]  [facility]  Medical Director of the Advanced Lipid Disorders &  Cardiovascular Risk Reduction Clinic Diplomate of the American Board of Clinical Lipidology Attending Cardiologist  Direct Dial: [phone]  Fax: [phone]  Website:  [email or web address] Nicely Zonnique Norkus 08/03/2023, 11:16 AM

## 2023-08-03 NOTE — Telephone Encounter (Signed)
Pharmacy Patient Advocate Encounter   Received notification from CoverMyMeds that prior authorization for repatha is required/requested.   Insurance verification completed.   The patient is insured through [facility] .   Per test claim: PA required; PA submitted to above mentioned insurance via CoverMyMeds Key/confirmation #/EOC [id number] Status is pending

## 2023-08-03 NOTE — Patient Instructions (Signed)
Medication Instructions:  Dr. Rennis Golden recommends Repatha 140mg /mL (PCSK9). This is an injectable cholesterol medication self-administered once every 14 days. This medication will likely need prior approval with your insurance company, which we will work on. If the medication is not approved initially, we may need to do an appeal with your insurance.   Administer medication in area of fatty tissue such as abdomen, outer thigh, back of upper arm - and rotate site with each injection Store medication in refrigerator until ready to administer - allow to sit at room temp for 30 mins - 1 hour prior to injection Dispose of medication in a SHARPS container - your pharmacy should be able to direct you on this and proper disposal   Patient Assistance:    These foundations have funds at various times.   The [employer]: https://www.panfoundation.org/disease-funds/hypercholesterolemia/ -- can sign up for wait list  The [facility] offers assistance to help pay for medication copays.  They will cover copays for all cholesterol lowering meds, including statins, fibrates, omega-3 fish oils like Vascepa, ezetimibe, Repatha, Praluent, Nexletol, Nexlizet.  The cards are usually good for $2,500 or 12 months, whichever comes first. Our fax # is [phone] (you will need this to apply) Go to healthwellfoundation.org Click on "Apply Now" Answer questions as to whom is applying (patient or representative) Your disease fund will be "hypercholesterolemia - Medicare access" They will ask questions about finances and which medications you are taking for cholesterol When you submit, the approval is usually within minutes.  You will need to print the card information from the site You will need to show this information to your pharmacy, they will bill your Medicare Part D plan first -then bill [employer] --for the copay.   You can also call them at [phone], although the hold times can be quite long.      *If you need a refill on your cardiac medications before your next appointment, please call your pharmacy*   Lab Work: FASTING NMR lipoprofile and LPa in 4 months  If you have labs (blood work) drawn today and your tests are completely normal, you will receive your results only by: MyChart Message (if you have MyChart) OR A paper copy in the mail If you have any lab test that is abnormal or we need to change your treatment, we will call you to review the results.   Follow-Up: At [facility], you and your health needs are our priority.  As part of our continuing mission to provide you with exceptional heart care, we have created designated Provider Care Teams.  These Care Teams include your primary Cardiologist (physician) and Advanced Practice Providers (APPs -  Physician Assistants and Nurse Practitioners) who all work together to provide you with the care you need, when you need it.  We recommend signing up for the patient portal called "MyChart".  Sign up information is provided on this After Visit Summary.  MyChart is used to connect with patients for Virtual Visits (Telemedicine).  Patients are able to view lab/test results, encounter notes, upcoming appointments, etc.  Non-urgent messages can be sent to your provider as well.   To learn more about what you can do with MyChart, go to [email or web address].    Your next appointment:   4 months with Dr. Rennis Golden

## 2023-08-03 NOTE — Telephone Encounter (Signed)
Pharmacy Patient Advocate Encounter  Received notification from [facility] that Prior Authorization for repatha has been APPROVED from 08/03/23 to 01/31/24. Ran test claim, Copay is $47.00- one month. This test claim was processed through [facility]- copay amounts may vary at other pharmacies due to pharmacy/plan contracts, or as the patient moves through the different stages of their insurance plan.   PA #/Case ID/Reference #: [id number]

## 2023-08-03 NOTE — Telephone Encounter (Signed)
-----   Message from Nurse Eileen Stanford E sent at 08/03/2023 11:56 AM EST ----- Regarding: repatha PA Hi team!   This patient needs a PA for Repatha [employer]   On max tolerated statin and zetia twice weekly, elevated LPa  MD note should be in later today or tomorrow  Thanks

## 2023-08-03 NOTE — Telephone Encounter (Signed)
Update sent to patient in MyChart 

## 2023-08-05 ENCOUNTER — Other Ambulatory Visit (HOSPITAL_COMMUNITY): Payer: Self-pay

## 2023-08-06 MED ORDER — REPATHA SURECLICK 140 MG/ML ~~LOC~~ SOAJ: 140.0000 mg | SUBCUTANEOUS | 3 refills | Status: DC

## 2023-08-06 NOTE — Addendum Note (Signed)
Addended by: Lindell Spar on: 08/06/2023 11:46 AM   Modules accepted: Orders

## 2023-09-21 ENCOUNTER — Encounter: Payer: Self-pay | Admitting: Internal Medicine

## 2023-09-21 NOTE — Telephone Encounter (Signed)
 Care team updated and letter sent for eye exam notes.

## 2023-09-30 ENCOUNTER — Ambulatory Visit: Payer: Medicare Other | Admitting: Family Medicine

## 2023-09-30 VITALS — Ht 67.0 in | Wt 179.0 lb

## 2023-09-30 DIAGNOSIS — Z0000 Encounter for general adult medical examination without abnormal findings: Secondary | ICD-10-CM

## 2023-09-30 NOTE — Progress Notes (Signed)
 PATIENT CHECK-IN and HEALTH RISK ASSESSMENT QUESTIONNAIRE:  -completed by phone/video for upcoming Medicare Preventive Visit  Pre-Visit Check-in: 1)Vitals (height, wt, BP, etc) - record in vitals section for visit on day of visit Request home vitals (wt, BP, etc.) and enter into vitals, THEN update Vital Signs SmartPhrase below at the top of the HPI. See below.  2)Review and Update Medications, Allergies PMH, Surgeries, Social history in Epic 3)Hospitalizations in the last year with date/reason? NO  4)Review and Update Care Team (patient's specialists) in Epic 5) Complete PHQ9 in Epic  6) Complete Fall Screening in Epic 7)Review all Health Maintenance Due and order under PCP if not done.  Medicare Wellness Patient Questionnaire:  Answer theses question about your habits: How often do you have a drink containing alcohol?twice a wk How many drinks containing alcohol do you have on a typical day when you are drinking?1 glass of wine How often do you have six or more drinks on one occasion?no Have you ever smoked?yes tried it at 73 yrs old, didn't like it. Maybe tried it two times.   Quit date if applicable? Pt refused to answer any question about smoking.   How many packs a day do/did you smoke? Pt refused to answer any question about smoking.   Do you use smokeless tobacco?no Do you use an illicit drugs?no On average, how many days per week do you engage in moderate to strenuous exercise (like a brisk walk)?4 days per wk On average, how many minutes do you engage in exercise at this level?walk for 50 mins, line dance for a hour for 4 days of the week.  Are you sexually active? Yes Number of partners?1 Typical breakfast:  oatmeal, fruit-apple or banana Typical lunch: sometimes salad, sandwich, popcorn Typical dinner:depends , go out eat often. Protein-salmon, salad, vegetable-peas, green bean, potato, occasionally mac n cheese, sweet potatoes Typical snacks: cake, candy. Don't eat every  day  Beverages: water, pepsi. Can't drink energy drinks  Answer theses question about your everyday activities: Can you perform most household chores?yes Are you deaf or have significant trouble hearing?no Do you feel that you have a problem with memory?sometimes Do you feel safe at home?yes Last dentist visit?07/2023 8. Do you have any difficulty performing your everyday activities?no Are you having any difficulty walking, taking medications on your own, and or difficulty managing daily home needs?no Do you have difficulty walking or climbing stairs?no Do you have difficulty dressing or bathing?no Do you have difficulty doing errands alone such as visiting a doctor's office or shopping?no Do you currently have any difficulty preparing food and eating?no Do you currently have any difficulty using the toilet?no Do you have any difficulty managing your finances?no Do you have any difficulties with housekeeping of managing your housekeeping?no   Do you have Advanced Directives in place (Living Will, Healthcare Power or Attorney)? yes   Last eye Exam and location?07/13/2023    Do you currently use prescribed or non-prescribed narcotic or opioid pain medications?no  Do you have a history or close family history of breast, ovarian, tubal or peritoneal cancer or a family member with BRCA (breast cancer susceptibility 1 and 2) gene mutations? no  Request home vitals (wt, BP, etc.) and enter into vitals, THEN update Vital Signs SmartPhrase below at the top of the HPI. See below.   Nurse/Assistant Credentials/time stamp: Karpuih M./CMA/1:00pm    ----------------------------------------------------------------------------------------------------------------------------------------------------------------------------------------------------------------------  Because this visit was a virtual/telehealth visit, some criteria may be missing or patient reported. Any vitals not documented  were  not able to be obtained and vitals that have been documented are patient reported.    MEDICARE ANNUAL PREVENTIVE VISIT WITH PROVIDER: (Welcome to Medicare, initial annual wellness or annual wellness exam)  Virtual Visit via Video Note  I connected with Karla Moore on 09/30/23 by a video enabled telemedicine application and verified that I am speaking with the correct person using two identifiers.  Location patient: home Location provider:work or home office Persons participating in the virtual visit: patient, provider  Concerns and/or follow up today: doing ok - but sometimes does drink 1/2 of a pepsi a day for the caffeine - feels helps give her energy. Has prediabetes on labs.   See HM section in Epic for other details of completed HM.    ROS: negative for report of fevers, unintentional weight loss, vision changes, vision loss, hearing loss or change, chest pain, sob, hemoptysis, melena, hematochezia, hematuria, falls, bleeding or bruising, thoughts of suicide or self harm, memory loss  Patient-completed extensive health risk assessment - reviewed and discussed with the patient: See Health Risk Assessment completed with patient prior to the visit either above or in recent phone note. This was reviewed in detailed with the patient today and appropriate recommendations, orders and referrals were placed as needed per Summary below and patient instructions.   Review of Medical History: -PMH, PSH, Family History and current specialty and care providers reviewed and updated and listed below   Patient Care Team: Theophilus Andrews, Tully GRADE, MD as PCP - General (Internal Medicine) JINNY Prince Fell DDS PA (Dentistry) [facility] Mona Vinie BROCKS, MD as Consulting Physician (Cardiology)   Past Medical History:  Diagnosis Date   Allergy    spring    Anxiety    GERD (gastroesophageal reflux disease)    Hx of adenomatous polyp of colon 08/03/2019    Hyperlipidemia     Past Surgical History:  Procedure Laterality Date   COLONOSCOPY     COLONOSCOPY WITH PROPOFOL  N/A 07/31/2019   Procedure: COLONOSCOPY WITH PROPOFOL ;  Surgeon: Avram Lupita BRAVO, MD;  Location: [facility];  Service: Endoscopy;  Laterality: N/A;   DILATION AND CURETTAGE OF UTERUS     x 2 per pt   POLYPECTOMY  07/31/2019   Procedure: POLYPECTOMY;  Surgeon: Avram Lupita BRAVO, MD;  Location: [facility];  Service: Endoscopy;;   TONSILLECTOMY     WISDOM TOOTH EXTRACTION      Social History   Socioeconomic History   Marital status: Married    Spouse name: Not on file   Number of children: Not on file   Years of education: Not on file   Highest education level: Bachelor's degree (e.g., BA, AB, BS)  Occupational History   Not on file  Tobacco Use   Smoking status: Never   Smokeless tobacco: Never  Substance and Sexual Activity   Alcohol use: Yes    Comment: socially    Drug use: Never   Sexual activity: Not on file  Other Topics Concern   Not on file  Social History Narrative   Not on file   Social Drivers of Health   Financial Resource Strain: Low Risk  (07/01/2022)   Overall Financial Resource Strain (CARDIA)    Difficulty of Paying Living Expenses: Not hard at all  Food Insecurity: No Food Insecurity (07/01/2022)   Hunger Vital Sign    Worried About Running Out of Food in the Last Year: Never true    Ran Out of Food  in the Last Year: Never true  Transportation Needs: No Transportation Needs (07/01/2022)   PRAPARE - Administrator, Civil Service (Medical): No    Lack of Transportation (Non-Medical): No  Physical Activity: Sufficiently Active (09/30/2023)   Exercise Vital Sign    Days of Exercise per Week: 4 days    Minutes of Exercise per Session: 60 min  Stress: No Stress Concern Present (07/01/2022)   [employer] of Occupational Health - Occupational Stress Questionnaire    Feeling of Stress : Not at all  Social Connections: Moderately  Integrated (07/01/2022)   Social Connection and Isolation Panel [NHANES]    Frequency of Communication with Friends and Family: More than three times a week    Frequency of Social Gatherings with Friends and Family: Once a week    Attends Religious Services: More than 4 times per year    Active Member of [employer] or Organizations: No    Attends Engineer, Structural: Not on file    Marital Status: Married  Catering Manager Violence: Not on file    Family History  Problem Relation Age of Onset   Colon cancer Mother 46       75's   Lung cancer Brother    Lung cancer Brother    Breast cancer Neg Hx    Colon polyps Neg Hx    Esophageal cancer Neg Hx    Rectal cancer Neg Hx    Stomach cancer Neg Hx     Current Outpatient Medications on File Prior to Visit  Medication Sig Dispense Refill   Acetaminophen (TYLENOL 8 HOUR ARTHRITIS PAIN PO) Take by mouth.     acetaminophen (TYLENOL) 500 MG tablet Take 500 mg by mouth every 6 (six) hours as needed (Cold).      cholecalciferol (VITAMIN D ) 1000 units tablet Take 2,000 Units by mouth daily.     Evolocumab  (REPATHA  SURECLICK) 140 MG/ML SOAJ Inject 140 mg into the skin every 14 (fourteen) days. 6 mL 3   ferrous sulfate (FEROSUL) 325 (65 FE) MG tablet Take 325 mg by mouth daily with breakfast. Three times a week     Magnesium 250 MG TABS Take 500 mg by mouth daily. Do not take on the weekends     Multiple Vitamin (MULTIVITAMIN) tablet Take 1 tablet by mouth daily. Do no take on the Weekend     omeprazole  (PRILOSEC) 20 MG capsule Take 20 mg by mouth as needed.     rosuvastatin  (CRESTOR ) 5 MG tablet Take 1 tablet (5 mg total) by mouth 2 (two) times a week. 60 tablet 1   vitamin E 200 UNIT capsule Take 200 Units by mouth daily. Do not take on Weekends     ALPRAZolam (XANAX) 0.25 MG tablet Take 0.25 mg by mouth as needed for anxiety.  (Patient not taking: Reported on 09/30/2023)     ezetimibe  (ZETIA ) 10 MG tablet Take 1 tablet (10 mg total) by  mouth daily. (Patient not taking: Reported on 09/30/2023) 90 tablet 3   No current facility-administered medications on file prior to visit.    Allergies  Allergen Reactions   Ibuprofen Other (See Comments)    Pt stated, It makes me feel like my heart is expanding; gave me pain       Physical Exam Vitals requested from patient and listed below if patient had equipment and was able to obtain at home for this virtual visit: Vitals:   Estimated body mass index is 28.04 kg/m as  calculated from the following:   Height as of this encounter: 5' 7 (1.702 m).   Weight as of this encounter: 179 lb (81.2 kg).  EKG (optional): deferred due to virtual visit  GENERAL: alert, oriented, no acute distress detected, full vision exam deferred due to pandemic and/or virtual encounter   HEENT: atraumatic, conjunttiva clear, no obvious abnormalities on inspection of external nose and ears  NECK: normal movements of the head and neck  LUNGS: on inspection no signs of respiratory distress, breathing rate appears normal, no obvious gross SOB, gasping or wheezing  CV: no obvious cyanosis  MS: moves all visible extremities without noticeable abnormality  PSYCH/NEURO: pleasant and cooperative, no obvious depression or anxiety, speech and thought processing grossly intact, Cognitive function grossly intact  Flowsheet Row Office Visit from 09/08/2022 in [facility] [place]  PHQ-9 Total Score 3           09/30/2023   12:38 PM 09/08/2022    9:50 AM 09/08/2022    9:11 AM 07/02/2022    9:27 AM  Depression screen PHQ 2/9  Decreased Interest 0 0 0 0  Down, Depressed, Hopeless 0 0 0 0  PHQ - 2 Score 0 0 0 0  Altered sleeping  0 0   Tired, decreased energy  2 2   Change in appetite  0 0   Feeling bad or failure about yourself   0 0   Trouble concentrating  1 1   Moving slowly or fidgety/restless  0 0   Suicidal thoughts  0 0   PHQ-9 Score  3 3   Difficult doing  work/chores  Somewhat difficult Somewhat difficult        07/01/2022    4:24 PM 09/08/2022    9:11 AM 09/08/2022    9:50 AM 09/30/2023   12:38 PM  Fall Risk  Falls in the past year? 0 0 0 0  Was there an injury with Fall?  0 0 0  Fall Risk Category Calculator  0 0 0  Fall Risk Category (Retired)  Low Low   (RETIRED) Patient Fall Risk Level  Low fall risk Low fall risk   Patient at Risk for Falls Due to  No Fall Risks No Fall Risks No Fall Risks  Fall risk Follow up  Falls evaluation completed Falls evaluation completed Falls evaluation completed     SUMMARY AND PLAN:  Encounter for Medicare annual wellness exam   Discussed applicable health maintenance/preventive health measures and advised and referred or ordered per patient preferences: -utd on bone density - discussed appropriate vit D and calcium  dosing - vit D being monitored by Dr. Theophilus and good last check.  Also discussed bone building exercise.  -she has physical with Dr. Theophilus coming up and can do the labs and foot exam then Health Maintenance  Topic Date Due   FOOT EXAM  Never done   Diabetic kidney evaluation - Urine ACR  Never done   Hepatitis C Screening  Never done   HEMOGLOBIN A1C  03/10/2023   Diabetic kidney evaluation - eGFR measurement  09/09/2023   OPHTHALMOLOGY EXAM  07/12/2024   Medicare Annual Wellness (AWV)  09/29/2024   MAMMOGRAM  07/05/2025   DTaP/Tdap/Td (3 - Td or Tdap) 01/04/2028   Colonoscopy  07/30/2029   Pneumonia Vaccine 27+ Years old  Completed   INFLUENZA VACCINE  Completed   DEXA SCAN  Completed   COVID-19 Vaccine  Completed   Zoster Vaccines- Shingrix  Completed  HPV VACCINES  Aged [employer] and counseling on the following was provided based on the above review of health and a plan/checklist for the patient, along with additional information discussed, was provided for the patient in the patient instructions :  -Advised and counseled on a healthy lifestyle -  including the importance of a healthy diet, regular physical activity, social connections and stress management. -Reviewed patient's current diet. Advised and counseled on a whole foods based healthy diet. Encouraged lowering added sugars and processed grains. Discussed healthier alternatives for caffeine and advised eliminating sodas ans sweets in terms of regular consumption. Advised increasing leafy greens veggies and cruciferous veggies and sticking to lower sugar fruits. Advise avoiding ultraprocessed foods and eating foods in whole form or closest to whole form as possible. A summary of a healthy diet was provided in the Patient Instructions.  -reviewed patient's current physical activity level and discussed exercise guidelines for adults. Discussed community resources and ideas for safe exercise at home to assist in meeting exercise guideline recommendations in a safe and healthy way. Advised including strength training/muscle building 2 days per week. She plans to add this to routine. Discussed bone building exercise.  -Advise yearly dental visits at minimum and regular eye exams -Advised and counseled on alcohol safe limits, risks  Follow up: see patient instructions     Patient Instructions  I really enjoyed getting to talk with you today! I am available on Tuesdays and Thursdays for virtual visits if you have any questions or concerns, or if I can be of any further assistance.   CHECKLIST FROM ANNUAL WELLNESS VISIT:  -Follow up (please call to schedule if not scheduled after visit):   -yearly for annual wellness visit with primary care office  Here is a list of your preventive care/health maintenance measures and the plan for each if any are due:  PLAN For any measures below that may be due:  -request foot exam and labs due at appt with Dr. Theophilus  Health Maintenance  Topic Date Due   FOOT EXAM  Never done   Diabetic kidney evaluation - Urine ACR  Never done   Hepatitis C  Screening  Never done   HEMOGLOBIN A1C  03/10/2023   Diabetic kidney evaluation - eGFR measurement  09/09/2023   OPHTHALMOLOGY EXAM  07/12/2024   Medicare Annual Wellness (AWV)  09/29/2024   MAMMOGRAM  07/05/2025   DTaP/Tdap/Td (3 - Td or Tdap) 01/04/2028   Colonoscopy  07/30/2029   Pneumonia Vaccine 70+ Years old  Completed   INFLUENZA VACCINE  Completed   DEXA SCAN  Completed   COVID-19 Vaccine  Completed   Zoster Vaccines- Shingrix  Completed   HPV VACCINES  Aged Out    -See a dentist at least yearly  -Get your eyes checked and then per your eye specialist's recommendations  -Other issues addressed today:   -I have included below further information regarding a healthy whole foods based diet, physical activity guidelines for adults, stress management and opportunities for social connections. I hope you find this information useful.   -----------------------------------------------------------------------------------------------------------------------------------------------------------------------------------------------------------------------------------------------------------  NUTRITION: -eat real food: lots of colorful vegetables (half the plate) and fruits -5-7 servings of vegetables and fruits per day (fresh or steamed is best), exp. 2 servings of vegetables with lunch and dinner and 2 servings of fruit per day. Berries and greens such as kale and collards are great choices.  -consume on a regular basis: whole grains (make sure first ingredient on  label contains the word whole), fresh fruits, fish, nuts, seeds, healthy oils (such as olive oil, avocado oil, grape seed oil) -may eat small amounts of dairy and lean meat on occasion, but avoid processed meats such as ham, bacon, lunch meat, etc. -drink water -try to avoid fast food and pre-packaged foods, processed meat -most experts advise limiting sodium to < 2300mg  per day, should limit further is any chronic conditions  such as high blood pressure, heart disease, diabetes, etc. The American Heart Association advised that < 1500mg  is is ideal -try to avoid foods that contain any ingredients with names you do not recognize  -try to avoid sugar/sweets (except for the natural sugar that occurs in fresh fruit) -try to avoid sweet drinks -try to avoid white rice, white bread, pasta (unless whole grain), white or yellow potatoes  EXERCISE GUIDELINES FOR ADULTS: -if you wish to increase your physical activity, do so gradually and with the approval of your doctor -STOP and seek medical care immediately if you have any chest pain, chest discomfort or trouble breathing when starting or increasing exercise  -move and stretch your body, legs, feet and arms when sitting for long periods -Physical activity guidelines for optimal health in adults: -least 150 minutes per week of aerobic exercise (can talk, but not sing) once approved by your doctor, 20-30 minutes of sustained activity or two 10 minute episodes of sustained activity every day.  -resistance training at least 2 days per week if approved by your doctor -balance exercises 3+ days per week:   Stand somewhere where you have something sturdy to hold onto if you lose balance.    1) lift up on toes, start with 5x per day and work up to 20x   2) stand and lift on leg straight out to the side so that foot is a few inches of the floor, start with 5x each side and work up to 20x each side   3) stand on one foot, start with 5 seconds each side and work up to 20 seconds on each side  If you need ideas or help with getting more active:  -Silver sneakers https://tools.silversneakers.com  -Walk with a Doc: Http://www.duncan-williams.com/  -try to include resistance (weight lifting/strength building) and balance exercises twice per week: or the following link for  ideas: http://castillo-powell.com/  buyducts.dk  STRESS MANAGEMENT: -can try meditating, or just sitting quietly with deep breathing while intentionally relaxing all parts of your body for 5 minutes daily -if you need further help with stress, anxiety or depression please follow up with your primary doctor or contact the wonderful folks at [facility]: [phone]  SOCIAL CONNECTIONS: -options in [place] if you wish to engage in more social and exercise related activities:  -Silver sneakers https://tools.silversneakers.com  -Walk with a Doc: Http://www.duncan-williams.com/  -Check out the [facility] Active Adults 50+ section on the [place] of [employer] companies (hiking clubs, book clubs, cards and games, chess, exercise classes, aquatic classes and much more) - see the website for details: https://www.[place].gov/departments/parks-recreation/active-adults50  -YouTube has lots of exercise videos for different ages and abilities as well  -[facility] (a variety of indoor and outdoor inperson activities for adults). [phone]. [address]  -Virtual Online Classes (a variety of topics): see seniorplanet.org or call [phone]  -consider volunteering at a school, hospice center, church, senior center or elsewhere           Karla JONELLE Cramp, DO

## 2023-09-30 NOTE — Patient Instructions (Signed)
 I really enjoyed getting to talk with you today! I am available on Tuesdays and Thursdays for virtual visits if you have any questions or concerns, or if I can be of any further assistance.   CHECKLIST FROM ANNUAL WELLNESS VISIT:  -Follow up (please call to schedule if not scheduled after visit):   -yearly for annual wellness visit with primary care office  Here is a list of your preventive care/health maintenance measures and the plan for each if any are due:  PLAN For any measures below that may be due:  -request foot exam and labs due at appt with Dr. Theophilus  Health Maintenance  Topic Date Due   FOOT EXAM  Never done   Diabetic kidney evaluation - Urine ACR  Never done   Hepatitis C Screening  Never done   HEMOGLOBIN A1C  03/10/2023   Diabetic kidney evaluation - eGFR measurement  09/09/2023   OPHTHALMOLOGY EXAM  07/12/2024   Medicare Annual Wellness (AWV)  09/29/2024   MAMMOGRAM  07/05/2025   DTaP/Tdap/Td (3 - Td or Tdap) 01/04/2028   Colonoscopy  07/30/2029   Pneumonia Vaccine 60+ Years old  Completed   INFLUENZA VACCINE  Completed   DEXA SCAN  Completed   COVID-19 Vaccine  Completed   Zoster Vaccines- Shingrix  Completed   HPV VACCINES  Aged Out    -See a dentist at least yearly  -Get your eyes checked and then per your eye specialist's recommendations  -Other issues addressed today:   -I have included below further information regarding a healthy whole foods based diet, physical activity guidelines for adults, stress management and opportunities for social connections. I hope you find this information useful.   -----------------------------------------------------------------------------------------------------------------------------------------------------------------------------------------------------------------------------------------------------------  NUTRITION: -eat real food: lots of colorful vegetables (half the plate) and fruits -5-7 servings of  vegetables and fruits per day (fresh or steamed is best), exp. 2 servings of vegetables with lunch and dinner and 2 servings of fruit per day. Berries and greens such as kale and collards are great choices.  -consume on a regular basis: whole grains (make sure first ingredient on label contains the word whole), fresh fruits, fish, nuts, seeds, healthy oils (such as olive oil, avocado oil, grape seed oil) -may eat small amounts of dairy and lean meat on occasion, but avoid processed meats such as ham, bacon, lunch meat, etc. -drink water -try to avoid fast food and pre-packaged foods, processed meat -most experts advise limiting sodium to < 2300mg  per day, should limit further is any chronic conditions such as high blood pressure, heart disease, diabetes, etc. The American Heart Association advised that < 1500mg  is is ideal -try to avoid foods that contain any ingredients with names you do not recognize  -try to avoid sugar/sweets (except for the natural sugar that occurs in fresh fruit) -try to avoid sweet drinks -try to avoid white rice, white bread, pasta (unless whole grain), white or yellow potatoes  EXERCISE GUIDELINES FOR ADULTS: -if you wish to increase your physical activity, do so gradually and with the approval of your doctor -STOP and seek medical care immediately if you have any chest pain, chest discomfort or trouble breathing when starting or increasing exercise  -move and stretch your body, legs, feet and arms when sitting for long periods -Physical activity guidelines for optimal health in adults: -least 150 minutes per week of aerobic exercise (can talk, but not sing) once approved by your doctor, 20-30 minutes of sustained activity or two 10 minute episodes of sustained  activity every day.  -resistance training at least 2 days per week if approved by your doctor -balance exercises 3+ days per week:   Stand somewhere where you have something sturdy to hold onto if you lose  balance.    1) lift up on toes, start with 5x per day and work up to 20x   2) stand and lift on leg straight out to the side so that foot is a few inches of the floor, start with 5x each side and work up to 20x each side   3) stand on one foot, start with 5 seconds each side and work up to 20 seconds on each side  If you need ideas or help with getting more active:  -Silver sneakers https://tools.silversneakers.com  -Walk with a Doc: Http://www.duncan-williams.com/  -try to include resistance (weight lifting/strength building) and balance exercises twice per week: or the following link for ideas: http://castillo-powell.com/  buyducts.dk  STRESS MANAGEMENT: -can try meditating, or just sitting quietly with deep breathing while intentionally relaxing all parts of your body for 5 minutes daily -if you need further help with stress, anxiety or depression please follow up with your primary doctor or contact the wonderful folks at [facility]: [phone]  SOCIAL CONNECTIONS: -options in [place] if you wish to engage in more social and exercise related activities:  -Silver sneakers https://tools.silversneakers.com  -Walk with a Doc: Http://www.duncan-williams.com/  -Check out the [facility] Active Adults 50+ section on the [place] of [employer] companies (hiking clubs, book clubs, cards and games, chess, exercise classes, aquatic classes and much more) - see the website for details: https://www.[place].gov/departments/parks-recreation/active-adults50  -YouTube has lots of exercise videos for different ages and abilities as well  -[facility] (a variety of indoor and outdoor inperson activities for adults). [phone]. [address]  -Virtual Online Classes (a variety of topics): see seniorplanet.org or call [phone]  -consider volunteering at a school, hospice  center, church, senior center or elsewhere

## 2023-10-14 ENCOUNTER — Encounter: Payer: Self-pay | Admitting: Internal Medicine

## 2023-10-14 ENCOUNTER — Ambulatory Visit (INDEPENDENT_AMBULATORY_CARE_PROVIDER_SITE_OTHER): Payer: Medicare Other | Admitting: Internal Medicine

## 2023-10-14 VITALS — BP 109/69 | Temp 98.1°F | Ht 67.0 in | Wt 180.8 lb

## 2023-10-14 DIAGNOSIS — E1169 Type 2 diabetes mellitus with other specified complication: Secondary | ICD-10-CM | POA: Diagnosis not present

## 2023-10-14 DIAGNOSIS — Z124 Encounter for screening for malignant neoplasm of cervix: Secondary | ICD-10-CM

## 2023-10-14 DIAGNOSIS — E782 Mixed hyperlipidemia: Secondary | ICD-10-CM

## 2023-10-14 DIAGNOSIS — Z1159 Encounter for screening for other viral diseases: Secondary | ICD-10-CM | POA: Diagnosis not present

## 2023-10-14 DIAGNOSIS — Z0000 Encounter for general adult medical examination without abnormal findings: Secondary | ICD-10-CM

## 2023-10-14 DIAGNOSIS — R7302 Impaired glucose tolerance (oral): Secondary | ICD-10-CM

## 2023-10-14 LAB — COMPREHENSIVE METABOLIC PANEL
ALT: 17 U/L (ref 0–35)
AST: 25 U/L (ref 0–37)
Albumin: 4.8 g/dL (ref 3.5–5.2)
Alkaline Phosphatase: 81 U/L (ref 39–117)
BUN: 15 mg/dL (ref 6–23)
CO2: 33 meq/L — ABNORMAL HIGH (ref 19–32)
Calcium: 10.2 mg/dL (ref 8.4–10.5)
Chloride: 103 meq/L (ref 96–112)
Creatinine, Ser: 1.03 mg/dL (ref 0.40–1.20)
GFR: 54.48 mL/min — ABNORMAL LOW (ref >60.00)
Glucose, Bld: 110 mg/dL — ABNORMAL HIGH (ref 70–99)
Potassium: 5 meq/L (ref 3.5–5.1)
Sodium: 142 meq/L (ref 135–145)
Total Bilirubin: 0.5 mg/dL (ref 0.2–1.2)
Total Protein: 8 g/dL (ref 6.0–8.3)

## 2023-10-14 LAB — MICROALBUMIN / CREATININE URINE RATIO
Creatinine,U: 137.5 mg/dL
Microalb Creat Ratio: 0.9 mg/g (ref 0.0–30.0)
Microalb, Ur: 1.2 mg/dL (ref 0.0–1.9)

## 2023-10-14 LAB — CBC WITH DIFFERENTIAL/PLATELET
Basophils Absolute: 0 10*3/uL (ref 0.0–0.1)
Basophils Relative: 0.9 % (ref 0.0–3.0)
Eosinophils Absolute: 0.1 10*3/uL (ref 0.0–0.7)
Eosinophils Relative: 1.3 % (ref 0.0–5.0)
HCT: 45.4 % (ref 36.0–46.0)
Hemoglobin: 14.8 g/dL (ref 12.0–15.0)
Lymphocytes Relative: 53.6 % — ABNORMAL HIGH (ref 12.0–46.0)
Lymphs Abs: 2.9 10*3/uL (ref 0.7–4.0)
MCHC: 32.6 g/dL (ref 30.0–36.0)
MCV: 86.1 fL (ref 78.0–100.0)
Monocytes Absolute: 0.4 10*3/uL (ref 0.1–1.0)
Monocytes Relative: 7.2 % (ref 3.0–12.0)
Neutro Abs: 2 10*3/uL (ref 1.4–7.7)
Neutrophils Relative %: 37 % — ABNORMAL LOW (ref 43.0–77.0)
Platelets: 271 10*3/uL (ref 150.0–400.0)
RBC: 5.27 Mil/uL — ABNORMAL HIGH (ref 3.87–5.11)
RDW: 14 % (ref 11.5–15.5)
WBC: 5.3 10*3/uL (ref 4.0–10.5)

## 2023-10-14 LAB — LIPID PANEL
Cholesterol: 170 mg/dL (ref 0–200)
HDL: 71.1 mg/dL (ref >39.00)
LDL Cholesterol: 83 mg/dL (ref 0–99)
NonHDL: 98.93
Total CHOL/HDL Ratio: 2
Triglycerides: 78 mg/dL (ref 0.0–149.0)
VLDL: 15.6 mg/dL (ref 0.0–40.0)

## 2023-10-14 LAB — HEMOGLOBIN A1C: Hgb A1c MFr Bld: 6.5 % (ref 4.6–6.5)

## 2023-10-14 NOTE — Progress Notes (Signed)
Established Patient Office Visit     CC/Reason for Visit: Annual preventive exam  HPI: Karla Moore is a 73 y.o. female who is coming in today for the above mentioned reasons. Past Medical History is significant for: Type 2 diabetes that is diet-controlled, GERD, hyperlipidemia who is now on Repatha.  She is otherwise doing well.  She always has an element of whitecoat syndrome, home blood pressure measurements have been reviewed and they are well within range.  The 2 most recent ones were 109/69 and 128/68.   Past Medical/Surgical History: Past Medical History:  Diagnosis Date   Allergy    spring    Anxiety    GERD (gastroesophageal reflux disease)    Hx of adenomatous polyp of colon 08/03/2019   Hyperlipidemia     Past Surgical History:  Procedure Laterality Date   COLONOSCOPY     COLONOSCOPY WITH PROPOFOL N/A 07/31/2019   Procedure: COLONOSCOPY WITH PROPOFOL;  Surgeon: Iva Boop, MD;  Location: [facility];  Service: Endoscopy;  Laterality: N/A;   DILATION AND CURETTAGE OF UTERUS     x 2 per pt   POLYPECTOMY  07/31/2019   Procedure: POLYPECTOMY;  Surgeon: Iva Boop, MD;  Location: [facility];  Service: Endoscopy;;   TONSILLECTOMY     WISDOM TOOTH EXTRACTION      Social History:  reports that she has never smoked. She has never used smokeless tobacco. She reports current alcohol use. She reports that she does not use drugs.  Allergies: Allergies  Allergen Reactions   Ibuprofen Other (See Comments)    Pt stated, "It makes me feel like my heart is expanding; gave me pain"    Family History:  Family History  Problem Relation Age of Onset   Colon cancer Mother 65       73's   Lung cancer Brother    Lung cancer Brother    Breast cancer Neg Hx    Colon polyps Neg Hx    Esophageal cancer Neg Hx    Rectal cancer Neg Hx    Stomach cancer Neg Hx      Current Outpatient Medications:    Acetaminophen (TYLENOL 8 HOUR ARTHRITIS PAIN PO),  Take by mouth., Disp: , Rfl:    acetaminophen (TYLENOL) 500 MG tablet, Take 500 mg by mouth every 6 (six) hours as needed (Cold). , Disp: , Rfl:    ALPRAZolam (XANAX) 0.25 MG tablet, Take 0.25 mg by mouth as needed for anxiety., Disp: , Rfl:    cholecalciferol (VITAMIN D) 1000 units tablet, Take 2,000 Units by mouth daily., Disp: , Rfl:    Evolocumab (REPATHA SURECLICK) 140 MG/ML SOAJ, Inject 140 mg into the skin every 14 (fourteen) days., Disp: 6 mL, Rfl: 3   ferrous sulfate (FEROSUL) 325 (65 FE) MG tablet, Take 325 mg by mouth daily with breakfast. Three times a week, Disp: , Rfl:    Magnesium 250 MG TABS, Take 50 mg by mouth daily. Do not take on the weekends, Disp: , Rfl:    Multiple Vitamin (MULTIVITAMIN) tablet, Take 1 tablet by mouth daily. Do no take on the Weekend, Disp: , Rfl:    omeprazole (PRILOSEC) 20 MG capsule, Take 20 mg by mouth as needed., Disp: , Rfl:    OVER THE COUNTER MEDICATION, Calcium 250 plus D3, Disp: , Rfl:    rosuvastatin (CRESTOR) 5 MG tablet, Take 1 tablet (5 mg total) by mouth 2 (two) times a week., Disp: 60 tablet, Rfl:  1   vitamin E 200 UNIT capsule, Take 400 Units by mouth daily. Do not take on Weekends, Disp: , Rfl:   Review of Systems:  Negative unless indicated in HPI.   Physical Exam: Vitals:   10/14/23 0938 10/14/23 0941 10/14/23 1002  BP: (!) 150/98 (!) 150/79 109/69  Temp: 98.1 F (36.7 C)    TempSrc: Oral    Weight: 180 lb 12.8 oz (82 kg)    Height: 5\' 7"  (1.702 m)      Body mass index is 28.32 kg/m.   Physical Exam Vitals reviewed.  Constitutional:      General: She is not in acute distress.    Appearance: Normal appearance. She is not ill-appearing, toxic-appearing or diaphoretic.  HENT:     Head: Normocephalic.     Right Ear: Tympanic membrane, ear canal and external ear normal. There is no impacted cerumen.     Left Ear: Tympanic membrane, ear canal and external ear normal. There is no impacted cerumen.     Nose: Nose normal.      Mouth/Throat:     Mouth: Mucous membranes are moist.     Pharynx: Oropharynx is clear. No oropharyngeal exudate or posterior oropharyngeal erythema.  Eyes:     General: No scleral icterus.       Right eye: No discharge.        Left eye: No discharge.     Conjunctiva/sclera: Conjunctivae normal.     Pupils: Pupils are equal, round, and reactive to light.  Neck:     Vascular: No carotid bruit.  Cardiovascular:     Rate and Rhythm: Normal rate and regular rhythm.     Pulses: Normal pulses.     Heart sounds: Normal heart sounds.  Pulmonary:     Effort: Pulmonary effort is normal. No respiratory distress.     Breath sounds: Normal breath sounds.  Abdominal:     General: Abdomen is flat. Bowel sounds are normal.     Palpations: Abdomen is soft.  Musculoskeletal:        General: Normal range of motion.     Cervical back: Normal range of motion.  Skin:    General: Skin is warm and dry.  Neurological:     General: No focal deficit present.     Mental Status: She is alert and oriented to person, place, and time. Mental status is at baseline.  Psychiatric:        Mood and Affect: Mood normal.        Behavior: Behavior normal.        Thought Content: Thought content normal.        Judgment: Judgment normal.     Impression and Plan:  Encounter for preventive health examination  Mixed hyperlipidemia  Encounter for hepatitis C screening test for low risk patient -     Hepatitis C antibody; Future  Type 2 diabetes mellitus with other specified complication, without long-term current use of insulin (HCC) -     Hemoglobin A1c; Future -     CBC with Differential/Platelet; Future -     Comprehensive metabolic panel; Future -     Lipid panel; Future -     Microalbumin / creatinine urine ratio; Future  Screening for cervical cancer -     Ambulatory referral to Gynecology   -Recommend routine eye and dental care. -Healthy lifestyle discussed in detail. -Labs to be updated  today. -Prostate cancer screening: N/A Health Maintenance  Topic Date Due  Yearly kidney health urinalysis for diabetes  Never done   Hepatitis C Screening  Never done   Hemoglobin A1C  03/10/2023   Yearly kidney function blood test for diabetes  09/09/2023   Eye exam for diabetics  07/12/2024   Medicare Annual Wellness Visit  09/29/2024   Complete foot exam   10/13/2024   Mammogram  07/05/2025   DTaP/Tdap/Td vaccine (3 - Td or Tdap) 01/04/2028   Colon Cancer Screening  07/30/2029   Pneumonia Vaccine  Completed   Flu Shot  Completed   DEXA scan (bone density measurement)  Completed   COVID-19 Vaccine  Completed   Zoster (Shingles) Vaccine  Completed   HPV Vaccine  Aged Out     -Advised to obtain pneumonia and RSV vaccines, she believes she has had at least the pneumonia at her pharmacy and would like to double check with them prior to duplicating vaccine, agree. -GYN referral.     Chaya Jan, MD [facility]

## 2023-10-15 LAB — HEPATITIS C ANTIBODY: Hepatitis C Ab: NONREACTIVE

## 2023-10-19 ENCOUNTER — Encounter: Payer: Self-pay | Admitting: Internal Medicine

## 2023-11-01 ENCOUNTER — Encounter: Payer: Self-pay | Admitting: Internal Medicine

## 2023-11-30 ENCOUNTER — Ambulatory Visit: Payer: Self-pay | Admitting: Internal Medicine

## 2023-11-30 ENCOUNTER — Encounter: Payer: Self-pay | Admitting: Family Medicine

## 2023-11-30 ENCOUNTER — Telehealth (INDEPENDENT_AMBULATORY_CARE_PROVIDER_SITE_OTHER): Admitting: Family Medicine

## 2023-11-30 VITALS — Temp 97.8°F | Wt 178.0 lb

## 2023-11-30 DIAGNOSIS — R0981 Nasal congestion: Secondary | ICD-10-CM | POA: Diagnosis not present

## 2023-11-30 MED ORDER — AZITHROMYCIN 250 MG PO TABS: ORAL_TABLET | ORAL | 0 refills | Status: DC

## 2023-11-30 NOTE — Patient Instructions (Addendum)
 -I sent the medication(s) we discussed to your pharmacy: Meds ordered this encounter  Medications   azithromycin (ZITHROMAX) 250 MG tablet    Sig: 2 tabs day 1, then one tab daily    Dispense:  6 tablet    Refill:  0   Try allegra and flonase (2 sprays each nostril once daily for 3 weeks) - spray away from the septum (middle of nose and if nose bleed stop use.  I hope you are feeling better soon!  Seek in person care promptly if your symptoms worsen, new concerns arise or you are not improving with treatment.  It was nice to meet you today. I help [place] out with telemedicine visits on Tuesdays and Thursdays and am happy to help if you need a virtual follow up visit on those days. Otherwise, if you have any concerns or questions following this visit please schedule a follow up visit with your Primary Care office or seek care at a local urgent care clinic to avoid delays in care. If you are having severe or life threatening symptoms please call 911 and/or go to the nearest emergency room.   Azithromycin Tablets What is this medication? AZITHROMYCIN (az ith roe MYE sin) treats infections caused by bacteria. It belongs to a group of medications called antibiotics. It will not treat colds, the flu, or infections caused by viruses. This medicine may be used for other purposes; ask your health care provider or pharmacist if you have questions. COMMON BRAND NAME(S): Zithromax, Zithromax Tri-Pak, Zithromax Z-Pak What should I tell my care team before I take this medication? They need to know if you have any of these conditions: History of blood diseases, such as leukemia History of irregular heartbeat Kidney disease Liver disease Myasthenia gravis An unusual or allergic reaction to azithromycin, other medications, foods, dyes, or preservatives Pregnant or trying to get pregnant Breastfeeding How should I use this medication? Take this medication by mouth with a full glass of water. Take  it as directed on the prescription label. You can take it with food or on an empty stomach. If it upsets your stomach, take it with food. Take your medication at regular intervals. Do not take your medication more often than directed. Take all of your medication unless your care team tells you to stop it early. Keep taking it even if you think you are better. Talk to your care team about the use of this medication in children. While it may be prescribed for children for selected conditions, precautions do apply. Overdosage: If you think you have taken too much of this medicine contact a poison control center or emergency room at once. NOTE: This medicine is only for you. Do not share this medicine with others. What if I miss a dose? If you miss a dose, take it as soon as you can. If it is almost time for your next dose, take only that dose. Do not take double or extra doses. What may interact with this medication? Do not take this medication with any of the following: Cisapride Dronedarone Pimozide Thioridazine This medication may also interact with the following: Antacids that contain aluminum or magnesium Colchicine Cyclosporine Digoxin Ergot alkaloids, such as dihydroergotamine, ergotamine Estrogen or progestin hormones Nelfinavir Other medications that cause heart rhythm change Phenytoin Warfarin This list may not describe all possible interactions. Give your health care provider a list of all the medicines, herbs, non-prescription drugs, or dietary supplements you use. Also tell them if you smoke, drink alcohol,  or use illegal drugs. Some items may interact with your medicine. What should I watch for while using this medication? Tell your care team if your symptoms do not start to get better or if they get worse. This medication may cause serious skin reactions. They can happen weeks to months after starting the medication. Contact your care team right away if you notice fevers or  flu-like symptoms with a rash. The rash may be red or purple and then turn into blisters or peeling of the skin. Or, you might notice a red rash with swelling of the face, lips or lymph nodes in your neck or under your arms. Do not treat diarrhea with over the counter products. Contact your care team if you have diarrhea that lasts more than 2 days or if it is severe and watery. This medication can make you more sensitive to the sun. Keep out of the sun. If you cannot avoid being in the sun, wear protective clothing and use sunscreen. Do not use sun lamps or tanning beds/booths. What side effects may I notice from receiving this medication? Side effects that you should report to your care team as soon as possible: Allergic reactions or angioedema--skin rash, itching, hives, swelling of the face, eyes, lips, tongue, arms, or legs, trouble swallowing or breathing Heart rhythm changes--fast or irregular heartbeat, dizziness, feeling faint or lightheaded, chest pain, trouble breathing Liver injury--right upper belly pain, loss of appetite, nausea, light-colored stool, dark yellow or brown urine, yellowing skin or eyes, unusual weakness or fatigue Rash, fever, and swollen lymph nodes Redness, blistering, peeling, or loosening of the skin, including inside the mouth Severe diarrhea, fever Unusual vaginal discharge, itching, or odor Side effects that usually do not require medical attention (report to your care team if they continue or are bothersome): Diarrhea Nausea Stomach pain Vomiting This list may not describe all possible side effects. Call your doctor for medical advice about side effects. You may report side effects to FDA at 1-800-FDA-1088. Where should I keep my medication? Keep out of the reach of children and pets. Store at room temperature between 15 and 30 degrees C (59 and 86 degrees F). Throw away any unused medication after the expiration date. NOTE: This sheet is a summary. It may  not cover all possible information. If you have questions about this medicine, talk to your doctor, pharmacist, or health care provider.  2024 Elsevier/Gold Standard (2022-06-05 00:00:00)

## 2023-11-30 NOTE — Telephone Encounter (Signed)
 Chief Complaint: Sinus congestion Symptoms: runny nose clear, vertigo, nose burning 7/10, cough x 1 day on Saturday Frequency: onset congestion 11/22/23 Pertinent Negatives: Patient denies fever, other symptoms Disposition: [] ED /[] Urgent Care (no appt availability in office) / [x] Appointment(In office/virtual)/ []  Index Virtual Care/ [] Home Care/ [] Refused Recommended Disposition /[]  Mobile Bus/ []  Follow-up with PCP Additional Notes: Patient says since 11/22/23 after having a nose bleed she started with sinus congestion. She's been using since 2/24 a saline nasal spray and loratadine. On 11/23/23 x 3 days she used dramamine for vertigo that she developed. On 11/25/23 she started using a 12 hour nasal decongestant sudafed/hydrochloride. She says the symptoms are not improving, nose burning, no facial pain, drainage down throat that cause her to constantly clear her throat. Advised OV virtual or in person. She says she thought something could be called in, advised not without a visit. No availability with PCP until Thursday, scheduled today virtual with a different provider. Instructed how to get on MyChart on her phone.    Copied From CRM [id number]. Reason for Triage: Patient states her sinus infection has become worse, woke up with a bloody nose on 2/24, experienced vertigo symptoms as well on 2/25.  Reason for Disposition  [1] Sinus congestion (pressure, fullness) AND [2] present > 10 days  Answer Assessment - Initial Assessment Questions 1. LOCATION: "Where does it hurt?"      No pain, just burning in nose 2. ONSET: "When did the sinus pain start?"  (e.g., hours, days)      No pain, onset of symptoms 11/22/23 after nose bleed 3. SEVERITY: "How bad is the pain?"   (Scale 1-10; mild, moderate or severe)   - MILD (1-3): doesn't interfere with normal activities    - MODERATE (4-7): interferes with normal activities (e.g., work or school) or awakens from sleep   - SEVERE (8-10):  excruciating pain and patient unable to do any normal activities        Nose burning 7/10 4. RECURRENT SYMPTOM: "Have you ever had sinus problems before?" If Yes, ask: "When was the last time?" and "What happened that time?"      Yes last year around this time, bacterial infection put on antibiotics 5. NASAL CONGESTION: "Is the nose blocked?" If Yes, ask: "Can you open it or must you breathe through your mouth?"     Nose blocked, able to breathe 6. NASAL DISCHARGE: "Do you have discharge from your nose?" If so ask, "What color?"     Clear color 7. FEVER: "Do you have a fever?" If Yes, ask: "What is it, how was it measured, and when did it start?"      No 8. OTHER SYMPTOMS: "Do you have any other symptoms?" (e.g., sore throat, cough, earache, difficulty breathing)     Sinus drainage into throat, cough on Sunday only, vertigo  Protocols used: Sinus Pain or Congestion-A-AH

## 2023-11-30 NOTE — Progress Notes (Signed)
 Virtual Visit via Video Note  I connected with Karla Moore  on 11/30/23 at 12:40 PM EST by a video enabled telemedicine application and verified that I am speaking with the correct person using two identifiers.  Location patient: Karla Moore Location provider:work or home office Persons participating in the virtual visit: patient, provider  I discussed the limitations and requested verbal permission for telemedicine visit. The patient expressed understanding and agreed to proceed.   HPI:  Acute telemedicine visit for sinus issues: -Onset: about 8 days ago -no known sick exposure, covid test negative today -Symptoms include: nasal congestion, had some sinus discomforton and off, drainage, sneezing, sinus issues, had some vertigo with it when it initially started for a few days, drainage is clear, cough -Denies: sinus pain/discomfort today, fever, chills, malaise, vomiting, diarrhea, rash, CP, SOB -reports hx of sinusitis and that in the past when has this PCP gives her azithromycin and it clears it right up -Pertinent past medical history: see below -Pertinent medication allergies: Allergies  Allergen Reactions   Ibuprofen Other (See Comments)    Pt stated, "It makes me feel like my heart is expanding; gave me pain"  -COVID-19 vaccine status:  Immunization History  Administered Date(s) Administered   Fluad Quad(high Dose 65+) 07/07/2022   Influenza, High Dose Seasonal PF 07/07/2022   Influenza-Unspecified 06/24/2023   PFIZER Comirnaty(Gray Top)Covid-19 Tri-Sucrose Vaccine 04/14/2021   PFIZER(Purple Top)SARS-COV-2 Vaccination 11/04/2019, 11/25/2019, 07/09/2020, 04/04/2021   PNEUMOCOCCAL CONJUGATE-20 09/08/2022   Pfizer Covid-19 Vaccine Bivalent Booster 73yrs & up 08/13/2021   Pfizer(Comirnaty)Fall Seasonal Vaccine 12 years and older 07/07/2022   Pneumococcal Polysaccharide-23 06/30/2017   Td 01/03/2018   Tdap 01/19/2008   Unspecified SARS-COV-2 Vaccination 07/07/2022, 06/24/2023   Zoster  Recombinant(Shingrix) 11/18/2017, 06/16/2022     ROS: See pertinent positives and negatives per HPI.  Past Medical History:  Diagnosis Date   Allergy    spring    Anxiety    GERD (gastroesophageal reflux disease)    Hx of adenomatous polyp of colon 08/03/2019   Hyperlipidemia     Past Surgical History:  Procedure Laterality Date   COLONOSCOPY     COLONOSCOPY WITH PROPOFOL N/A 07/31/2019   Procedure: COLONOSCOPY WITH PROPOFOL;  Surgeon: Iva Boop, MD;  Location: [facility];  Service: Endoscopy;  Laterality: N/A;   DILATION AND CURETTAGE OF UTERUS     x 2 per pt   POLYPECTOMY  07/31/2019   Procedure: POLYPECTOMY;  Surgeon: Iva Boop, MD;  Location: [facility];  Service: Endoscopy;;   TONSILLECTOMY     WISDOM TOOTH EXTRACTION       Current Outpatient Medications:    azithromycin (ZITHROMAX) 250 MG tablet, 2 tabs day 1, then one tab daily, Disp: 6 tablet, Rfl: 0   Acetaminophen (TYLENOL 8 HOUR ARTHRITIS PAIN PO), Take by mouth., Disp: , Rfl:    acetaminophen (TYLENOL) 500 MG tablet, Take 500 mg by mouth every 6 (six) hours as needed (Cold). , Disp: , Rfl:    ALPRAZolam (XANAX) 0.25 MG tablet, Take 0.25 mg by mouth as needed for anxiety., Disp: , Rfl:    cholecalciferol (VITAMIN D) 1000 units tablet, Take 2,000 Units by mouth daily., Disp: , Rfl:    Evolocumab (REPATHA SURECLICK) 140 MG/ML SOAJ, Inject 140 mg into the skin every 14 (fourteen) days., Disp: 6 mL, Rfl: 3   ferrous sulfate (FEROSUL) 325 (65 FE) MG tablet, Take 325 mg by mouth daily with breakfast. Three times a week, Disp: , Rfl:    Magnesium 250  MG TABS, Take 50 mg by mouth daily. Do not take on the weekends, Disp: , Rfl:    Multiple Vitamin (MULTIVITAMIN) tablet, Take 1 tablet by mouth daily. Do no take on the Weekend, Disp: , Rfl:    omeprazole (PRILOSEC) 20 MG capsule, Take 20 mg by mouth as needed., Disp: , Rfl:    OVER THE COUNTER MEDICATION, Calcium 250 plus D3, Disp: , Rfl:    rosuvastatin  (CRESTOR) 5 MG tablet, Take 1 tablet (5 mg total) by mouth 2 (two) times a week., Disp: 60 tablet, Rfl: 1   vitamin E 200 UNIT capsule, Take 400 Units by mouth daily. Do not take on Weekends, Disp: , Rfl:   EXAM:  VITALS per patient if applicable: Today's Vitals   11/30/23 1227  Temp: 97.8 F (36.6 C)  Weight: 178 lb (80.7 kg)   Body mass index is 27.88 kg/m.   GENERAL: alert, oriented, appears well and in no acute distress  HEENT: atraumatic, conjunttiva clear, no obvious abnormalities on inspection of external nose and ears  NECK: normal movements of the head and neck  LUNGS: on inspection no signs of respiratory distress, breathing rate appears normal, no obvious gross SOB, gasping or wheezing  CV: no obvious cyanosis  MS: moves all visible extremities without noticeable abnormality  PSYCH/NEURO: pleasant and cooperative, no obvious depression or anxiety, speech and thought processing grossly intact  ASSESSMENT AND PLAN:  Discussed the following assessment and plan:  Nasal congestion  -we discussed possible serious and likely etiologies, options for evaluation and workup, limitations of telemedicine visit vs in person visit, treatment, treatment risks and precautions. Pt is agreeable to treatment via telemedicine at this moment. Query viral URI vs other. May also have some season allergies as has hx of that as well. She opted to try nasal saline (as that has seemed to help), allegra and flonase. She also requests abx given hx of sinusitis and is leaving on a trip in case needed - discussed signs and symptoms of bacterial sinusitis, risks and proper use. Discussed recommended abx for sinusitis, she prefers zpack as has worked for her in the past.  Advised to seek prompt virtual visit or in person care if worsening, new symptoms arise, or if is not improving with treatment as expected per our conversation of expected course. Discussed options for follow up care. Did let this  patient know that I do telemedicine on Tuesdays and Thursdays for [facility] and those are the days I am logged into the system. Advised to schedule follow up visit with PCP, [place] virtual visits or [facility] if any further questions or concerns to avoid delays in care.   I discussed the assessment and treatment plan with the patient. The patient was provided an opportunity to ask questions and all were answered. The patient agreed with the plan and demonstrated an understanding of the instructions.     Terressa Koyanagi, DO

## 2023-12-02 DIAGNOSIS — E1169 Type 2 diabetes mellitus with other specified complication: Secondary | ICD-10-CM | POA: Diagnosis not present

## 2023-12-02 DIAGNOSIS — E785 Hyperlipidemia, unspecified: Secondary | ICD-10-CM | POA: Diagnosis not present

## 2023-12-04 LAB — NMR, LIPOPROFILE
Cholesterol, Total: 163 mg/dL (ref 100–199)
HDL Particle Number: 35.7 umol/L (ref >=30.5)
HDL-C: 71 mg/dL (ref >39)
LDL Particle Number: 804 nmol/L (ref <1000)
LDL Size: 22 nm (ref >20.5)
LDL-C (NIH Calc): 79 mg/dL (ref 0–99)
LP-IR Score: <25 (ref <=45)
Small LDL Particle Number: 145 nmol/L (ref <=527)
Triglycerides: 67 mg/dL (ref 0–149)

## 2023-12-04 LAB — LIPOPROTEIN A (LPA): Lipoprotein (a): 306.8 nmol/L — ABNORMAL HIGH (ref <75.0)

## 2023-12-14 ENCOUNTER — Ambulatory Visit (HOSPITAL_BASED_OUTPATIENT_CLINIC_OR_DEPARTMENT_OTHER): Payer: Medicare Other | Admitting: Internal Medicine

## 2023-12-14 VITALS — BP 152/72 | HR 68 | Ht 67.0 in | Wt 186.2 lb

## 2023-12-14 DIAGNOSIS — E7841 Elevated Lipoprotein(a): Secondary | ICD-10-CM

## 2023-12-14 DIAGNOSIS — M791 Myalgia, unspecified site: Secondary | ICD-10-CM

## 2023-12-14 DIAGNOSIS — E785 Hyperlipidemia, unspecified: Secondary | ICD-10-CM

## 2023-12-14 DIAGNOSIS — T466X5D Adverse effect of antihyperlipidemic and antiarteriosclerotic drugs, subsequent encounter: Secondary | ICD-10-CM | POA: Diagnosis not present

## 2023-12-14 DIAGNOSIS — E1169 Type 2 diabetes mellitus with other specified complication: Secondary | ICD-10-CM

## 2023-12-14 NOTE — Patient Instructions (Signed)
 Medication Instructions:  NO CHANGES  *If you need a refill on your cardiac medications before your next appointment, please call your pharmacy*   Lab Work: FASTING lab work in 1 year -- complete about a week before next appointment   If you have labs (blood work) drawn today and your tests are completely normal, you will receive your results only by: MyChart Message (if you have MyChart) OR A paper copy in the mail If you have any lab test that is abnormal or we need to change your treatment, we will call you to review the results.   Follow-Up: At [facility], you and your health needs are our priority.  As part of our continuing mission to provide you with exceptional heart care, we have created designated Provider Care Teams.  These Care Teams include your primary Cardiologist (physician) and Advanced Practice Providers (APPs -  Physician Assistants and Nurse Practitioners) who all work together to provide you with the care you need, when you need it.  We recommend signing up for the patient portal called "MyChart".  Sign up information is provided on this After Visit Summary.  MyChart is used to connect with patients for Virtual Visits (Telemedicine).  Patients are able to view lab/test results, encounter notes, upcoming appointments, etc.  Non-urgent messages can be sent to your provider as well.   To learn more about what you can do with MyChart, go to [email or web address].    Your next appointment:    Karla Bridegroom NP -- lipid clinic

## 2023-12-14 NOTE — Progress Notes (Signed)
 LIPID CLINIC CONSULT NOTE  Chief Complaint:  Follow-up dyslipidemia  Primary Care Physician: Philip Aspen, Limmie Patricia, MD  Primary Cardiologist:  None  HPI:  Karla Moore is a 73 y.o. female who is being seen today for the evaluation of dyslipidemia at the request of Philip Aspen, Almira Bar*. This is a pleasant 73 year old female, referred for evaluation management of dyslipidemia.  She has a history of statin intolerance having tried atorvastatin, rosuvastatin and other statins in the past but more recently has been tolerating very low-dose rosuvastatin 5 mg twice a week.  She had previously had myalgias which were significant.  She was recently diagnosed with type 2 diabetes based on hemoglobin A1c of 6.5 although has had some dietary discretions recently.  She said she continues to struggle with fatigue of unknown etiology and has started drinking Pepsi on a regular basis and eating chocolate to help with her fatigue.  The chocolate actually was added because she was having issues with constipation but then is also been having some indigestion and been using antacids for that.  I suspect that the chocolate also worsens her indigestion.  Despite this she has a dyslipidemia and technically as a diabetic hide her target LDL is less than 70.  Most recent lipids in December showed total cholesterol 214, HDL 72, triglycerides 83 and LDL 125.  She has no known cardiovascular disease.  It does not sound like there is a significant history of heart disease in the family.  Primarily, cancers in the family.  08/03/2023  Karla Moore returns today for follow-up.  She has had some mild improvement in her lipids however cholesterol remains elevated.  Her LDL particle number is 1033 with an LDL of 104, HDL 82 and triglycerides 66.  Her LP(a) however is elevated at 283.3 nmol/L.  We discussed the consequences of this today.  In addition I mentioned that standard therapy such as the statins do not  lower LP(a) rather she would likely benefit from PCSK9 inhibitor therapy.  This is in addition to the fact that her cholesterol remains above target LDL less than 70.  Although she was noted to be recently diagnosed with type 2 diabetes by her PCP, she says that her A1c now is lower and that she should not have been given that diagnosis.  12/14/2023  Karla Moore is seen today in follow-up.  She is tolerating therapy on Repatha and has had an improvement in her lipids although not necessarily robust.  LDL particle number now 804 down from 1033.  Her LDL is 79 down from 104 and her LP(a), however has gone up slightly to 306.8 from 283.  Overall this represents some improvement in her cholesterol, however difficult to explain why her LP(a) is higher.  PMHx:  Past Medical History:  Diagnosis Date   Allergy    spring    Anxiety    GERD (gastroesophageal reflux disease)    Hx of adenomatous polyp of colon 08/03/2019   Hyperlipidemia     Past Surgical History:  Procedure Laterality Date   COLONOSCOPY     COLONOSCOPY WITH PROPOFOL N/A 07/31/2019   Procedure: COLONOSCOPY WITH PROPOFOL;  Surgeon: Iva Boop, MD;  Location: [facility];  Service: Endoscopy;  Laterality: N/A;   DILATION AND CURETTAGE OF UTERUS     x 2 per pt   POLYPECTOMY  07/31/2019   Procedure: POLYPECTOMY;  Surgeon: Iva Boop, MD;  Location: [facility];  Service: Endoscopy;;   TONSILLECTOMY  WISDOM TOOTH EXTRACTION      FAMHx:  Family History  Problem Relation Age of Onset   Colon cancer Mother 65       72's   Lung cancer Brother    Lung cancer Brother    Breast cancer Neg Hx    Colon polyps Neg Hx    Esophageal cancer Neg Hx    Rectal cancer Neg Hx    Stomach cancer Neg Hx     SOCHx:   reports that she has never smoked. She has never used smokeless tobacco. She reports current alcohol use. She reports that she does not use drugs.  ALLERGIES:  Allergies  Allergen Reactions   Ibuprofen Other (See  Comments)    Pt stated, "It makes me feel like my heart is expanding; gave me pain"    ROS: Pertinent items noted in HPI and remainder of comprehensive ROS otherwise negative.  HOME MEDS: Current Outpatient Medications on File Prior to Visit  Medication Sig Dispense Refill   Acetaminophen (TYLENOL 8 HOUR ARTHRITIS PAIN PO) Take by mouth.     acetaminophen (TYLENOL) 500 MG tablet Take 500 mg by mouth every 6 (six) hours as needed (Cold).      ALPRAZolam (XANAX) 0.25 MG tablet Take 0.25 mg by mouth as needed for anxiety.     azithromycin (ZITHROMAX) 250 MG tablet 2 tabs day 1, then one tab daily 6 tablet 0   cholecalciferol (VITAMIN D) 1000 units tablet Take 2,000 Units by mouth daily.     Evolocumab (REPATHA SURECLICK) 140 MG/ML SOAJ Inject 140 mg into the skin every 14 (fourteen) days. 6 mL 3   ferrous sulfate (FEROSUL) 325 (65 FE) MG tablet Take 325 mg by mouth daily with breakfast. Three times a week     Magnesium 250 MG TABS Take 50 mg by mouth daily. Do not take on the weekends     Multiple Vitamin (MULTIVITAMIN) tablet Take 1 tablet by mouth daily. Do no take on the Weekend     omeprazole (PRILOSEC) 20 MG capsule Take 20 mg by mouth as needed.     OVER THE COUNTER MEDICATION Calcium 250 plus D3     rosuvastatin (CRESTOR) 5 MG tablet Take 1 tablet (5 mg total) by mouth 2 (two) times a week. 60 tablet 1   vitamin E 200 UNIT capsule Take 400 Units by mouth daily. Do not take on Weekends     No current facility-administered medications on file prior to visit.    LABS/IMAGING: No results found for this or any previous visit (from the past 48 hours). No results found.  LIPID PANEL:    Component Value Date/Time   CHOL 170 10/14/2023 1007   CHOL 204 (H) 07/27/2023 0855   TRIG 78.0 10/14/2023 1007   HDL 71.10 10/14/2023 1007   HDL 77 07/27/2023 0855   CHOLHDL 2 10/14/2023 1007   VLDL 15.6 10/14/2023 1007   LDLCALC 83 10/14/2023 1007   LDLCALC 114 (H) 07/27/2023 0855     WEIGHTS: Wt Readings from Last 3 Encounters:  12/14/23 186 lb 3.2 oz (84.5 kg)  11/30/23 178 lb (80.7 kg)  10/14/23 180 lb 12.8 oz (82 kg)    VITALS: BP (!) 152/72   Pulse 68   Ht 5\' 7"  (1.702 m)   Wt 186 lb 3.2 oz (84.5 kg)   SpO2 100%   BMI 29.16 kg/m   EXAM: Deferred  EKG: Deferred  ASSESSMENT: Mixed dyslipidemia, goal LDL less than 70 Type 2  diabetes-A1c 6.5%, diet controlled Statin intolerance-myalgias 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   Karla Moore is near target LDL less than 70 with the addition of Repatha but she has had a mild increase in LP(a).  We talked about upcoming clinical trials however her calcium score was not high enough likely to qualify her for that even though her LP(a) is over 200 nmol/L.  I would advise continuing with her current therapy and at some point she might be candidate for an additional clinical trial.  Plan repeat lipids and follow-up with [place] in 1 year or sooner as necessary.  Chrystie Nose, MD, [facility], FACP  [place] [facility]  [facility]  Medical Director of the Advanced Lipid Disorders &  Cardiovascular Risk Reduction Clinic Diplomate of the American Board of Clinical Lipidology Attending Cardiologist  Direct Dial: [phone]  Fax: [phone]  Website:  [email or web address] 12/14/2023, 2:47 PM

## 2023-12-15 ENCOUNTER — Other Ambulatory Visit (HOSPITAL_COMMUNITY)
Admission: RE | Admit: 2023-12-15 | Discharge: 2023-12-15 | Disposition: A | Discharge status: Home / Self Care | Source: Ambulatory Visit | Attending: Obstetrics and Gynecology | Admitting: Obstetrics and Gynecology

## 2023-12-15 ENCOUNTER — Encounter: Payer: Self-pay | Admitting: Obstetrics and Gynecology

## 2023-12-15 ENCOUNTER — Ambulatory Visit (INDEPENDENT_AMBULATORY_CARE_PROVIDER_SITE_OTHER): Payer: Medicare Other | Admitting: Obstetrics and Gynecology

## 2023-12-15 VITALS — BP 162/78 | HR 73 | Temp 98.4°F | Ht 67.0 in | Wt 185.0 lb

## 2023-12-15 DIAGNOSIS — Z1151 Encounter for screening for human papillomavirus (HPV): Secondary | ICD-10-CM | POA: Insufficient documentation

## 2023-12-15 DIAGNOSIS — Z01419 Encounter for gynecological examination (general) (routine) without abnormal findings: Secondary | ICD-10-CM | POA: Insufficient documentation

## 2023-12-15 DIAGNOSIS — N951 Menopausal and female climacteric states: Secondary | ICD-10-CM | POA: Insufficient documentation

## 2023-12-15 DIAGNOSIS — Z124 Encounter for screening for malignant neoplasm of cervix: Secondary | ICD-10-CM

## 2023-12-15 MED ORDER — GABAPENTIN 100 MG PO CAPS: ORAL_CAPSULE | ORAL | 0 refills | Status: DC

## 2023-12-15 NOTE — Assessment & Plan Note (Signed)
 Discussed nonhormonal options for VMS, including SSRIs and veozah. SSRIs can have side effects such as hypotension, mood irritability, GI upset, weight gain, sexual dysfunction, and drowsiness. Suicidal ideation can happpen in a subset of patient, and we reviewed that the medication would need to be stopped for this reason.  Allyne Gee is not generally covered by Medicare at this time. Recommend gabapentin nightly for nighttime symptoms.  Reviewed that it is not FDA approved for this indication however has been studied and is effective. Patient elects for gabapentin.

## 2023-12-15 NOTE — Progress Notes (Signed)
 Kegel 3 out of 5 73 y.o. G4P0040 postmenopausal female with osteopenia here for annual exam. Married. Retired. Traveling with husband.  Here for PAP, recommended by PCP. Thought she completing screening with prior PCP at 73yo. No history of abnormal PAP. Night sweats that wake her up at night nightly. Daytime sx have improved since menopause.  She has not tried any medical therapies for night sweats or hot flashes.  She notes that her mother had hot flashes into her 46s. Urinary frequency, voids approximately 6 times per day (mostly in the mornings).  Nocturia 1 time at night.  Postmenopausal bleeding: none Pelvic discharge or pain: none Breast mass, nipple discharge or skin changes : none Last PAP: No results found for: "DIAGPAP", "HPVHIGH", "ADEQPAP" Last mammogram: 07/21/2023 BI-RADS 2, density C Last colonoscopy: 07/31/2019 Last DXA: 03/01/2023 T-score -1.7, FRAX 4.8%, 0.8% Sexually active: Yes Exercising: Yes, walking and line dancing Smoker: No  GYN HISTORY: No significant history  OB History  Gravida Para Term Preterm AB Living  4 0 0 0 4 0  SAB IAB Ectopic Multiple Live Births  4 0 0 0 0    # Outcome Date GA Lbr Len/2nd Weight Sex Type Anes PTL Lv  4 SAB           3 SAB           2 SAB           1 SAB             Past Medical History:  Diagnosis Date   Allergy    spring    Anxiety    GERD (gastroesophageal reflux disease)    Hx of adenomatous polyp of colon 08/03/2019   Hyperlipidemia     Past Surgical History:  Procedure Laterality Date   COLONOSCOPY     COLONOSCOPY WITH PROPOFOL N/A 07/31/2019   Procedure: COLONOSCOPY WITH PROPOFOL;  Surgeon: Iva Boop, MD;  Location: [facility];  Service: Endoscopy;  Laterality: N/A;   DILATION AND CURETTAGE OF UTERUS     x 2 per pt   POLYPECTOMY  07/31/2019   Procedure: POLYPECTOMY;  Surgeon: Iva Boop, MD;  Location: [facility];  Service: Endoscopy;;   TONSILLECTOMY     WISDOM TOOTH EXTRACTION       Current Outpatient Medications on File Prior to Visit  Medication Sig Dispense Refill   Acetaminophen (TYLENOL 8 HOUR ARTHRITIS PAIN PO) Take by mouth.     acetaminophen (TYLENOL) 500 MG tablet Take 500 mg by mouth every 6 (six) hours as needed (Cold).      cholecalciferol (VITAMIN D) 1000 units tablet Take 2,000 Units by mouth daily.     Evolocumab (REPATHA SURECLICK) 140 MG/ML SOAJ Inject 140 mg into the skin every 14 (fourteen) days. 6 mL 3   ferrous sulfate (FEROSUL) 325 (65 FE) MG tablet Take 325 mg by mouth daily with breakfast. Three times a week     Magnesium 250 MG TABS Take 50 mg by mouth daily. Do not take on the weekends     Multiple Vitamin (MULTIVITAMIN) tablet Take 1 tablet by mouth daily. Do no take on the Weekend     omeprazole (PRILOSEC) 20 MG capsule Take 20 mg by mouth as needed.     OVER THE COUNTER MEDICATION Calcium 250 plus D3     rosuvastatin (CRESTOR) 5 MG tablet Take 1 tablet (5 mg total) by mouth 2 (two) times a week. 60 tablet 1   vitamin E 200  UNIT capsule Take 400 Units by mouth daily. Do not take on Weekends     No current facility-administered medications on file prior to visit.    Social History   Socioeconomic History   Marital status: Married    Spouse name: Not on file   Number of children: Not on file   Years of education: Not on file   Highest education level: Bachelor's degree (e.g., BA, AB, BS)  Occupational History   Not on file  Tobacco Use   Smoking status: Never   Smokeless tobacco: Never  Substance and Sexual Activity   Alcohol use: Yes    Comment: socially    Drug use: Never   Sexual activity: Yes  Other Topics Concern   Not on file  Social History Narrative   Not on file   Social Drivers of Health   Financial Resource Strain: Low Risk  (10/13/2023)   Overall Financial Resource Strain (CARDIA)    Difficulty of Paying Living Expenses: Not hard at all  Food Insecurity: No Food Insecurity (10/13/2023)   Hunger Vital Sign     Worried About Running Out of Food in the Last Year: Never true    Ran Out of Food in the Last Year: Never true  Transportation Needs: No Transportation Needs (10/13/2023)   PRAPARE - Administrator, Civil Service (Medical): No    Lack of Transportation (Non-Medical): No  Physical Activity: Sufficiently Active (10/13/2023)   Exercise Vital Sign    Days of Exercise per Week: 4 days    Minutes of Exercise per Session: 110 min  Stress: No Stress Concern Present (07/01/2022)   [employer] of Occupational Health - Occupational Stress Questionnaire    Feeling of Stress : Not at all  Social Connections: Unknown (10/13/2023)   Social Connection and Isolation Panel [NHANES]    Frequency of Communication with Friends and Family: More than three times a week    Frequency of Social Gatherings with Friends and Family: More than three times a week    Attends Religious Services: Patient declined    Database administrator or Organizations: Yes    Attends Engineer, structural: Not on file    Marital Status: Married  Catering manager Violence: Not on file    Family History  Problem Relation Age of Onset   Colon cancer Mother 71       76's   Lung cancer Brother    Lung cancer Brother    Breast cancer Neg Hx    Colon polyps Neg Hx    Esophageal cancer Neg Hx    Rectal cancer Neg Hx    Stomach cancer Neg Hx     Allergies  Allergen Reactions   Ibuprofen Other (See Comments)    Pt stated, "It makes me feel like my heart is expanding; gave me pain"      PE Today's Vitals   12/15/23 0937  BP: (!) 162/78  Pulse: 73  Temp: 98.4 F (36.9 C)  TempSrc: Oral  SpO2: 99%  Weight: 185 lb (83.9 kg)  Height: 5\' 7"  (1.702 m)   Body mass index is 28.98 kg/m.  Physical Exam Vitals reviewed. Exam conducted with a chaperone present.  Constitutional:      General: She is not in acute distress.    Appearance: Normal appearance.  HENT:     Head: Normocephalic and  atraumatic.     Nose: Nose normal.  Eyes:     Extraocular Movements: Extraocular  movements intact.     Conjunctiva/sclera: Conjunctivae normal.  Neck:     Thyroid: No thyroid mass, thyromegaly or thyroid tenderness.  Pulmonary:     Effort: Pulmonary effort is normal.  Chest:     Chest wall: No mass or tenderness.  Breasts:    Right: Normal. No swelling, mass, nipple discharge, skin change or tenderness.     Left: Normal. No swelling, mass, nipple discharge, skin change or tenderness.  Abdominal:     General: There is no distension.     Palpations: Abdomen is soft.     Tenderness: There is no abdominal tenderness.  Genitourinary:    General: Normal vulva.     Exam position: Lithotomy position.     Urethra: No prolapse.     Vagina: Normal. No vaginal discharge or bleeding.     Cervix: Normal. No lesion.     Uterus: Normal. Not enlarged and not tender.      Adnexa: Right adnexa normal and left adnexa normal.  Musculoskeletal:        General: Normal range of motion.     Cervical back: Normal range of motion.  Lymphadenopathy:     Upper Body:     Right upper body: No axillary adenopathy.     Left upper body: No axillary adenopathy.     Lower Body: No right inguinal adenopathy. No left inguinal adenopathy.  Skin:    General: Skin is warm and dry.  Neurological:     General: No focal deficit present.     Mental Status: She is alert.  Psychiatric:        Mood and Affect: Mood normal.        Behavior: Behavior normal.       Assessment and Plan:        Well woman exam with routine gynecological exam Assessment & Plan: Cervical cancer screening completed at age 73 given history of normal Pap smears.  However offered continue screening after risk and benefits discussion.  Patient would like updated Pap smear this year. If normal then she would like to discontinue screening. Encouraged annual mammogram screening Colonoscopy UTD DXA UTD Labs and immunizations with her  primary Encouraged safe sexual practices as indicated Encouraged healthy lifestyle practices with diet and exercise For patients over 70yo, I recommend 1200mg  calcium daily and 800IU of vitamin D daily. Normal urinary symptoms.  Encouraged Kegel exercises and recommend limiting caffeine.   Vasomotor symptoms due to menopause Assessment & Plan: Discussed nonhormonal options for VMS, including SSRIs and veozah. SSRIs can have side effects such as hypotension, mood irritability, GI upset, weight gain, sexual dysfunction, and drowsiness. Suicidal ideation can happpen in a subset of patient, and we reviewed that the medication would need to be stopped for this reason.  Allyne Gee is not generally covered by Medicare at this time. Recommend gabapentin nightly for nighttime symptoms.  Reviewed that it is not FDA approved for this indication however has been studied and is effective. Patient elects for gabapentin.    Orders: -     Gabapentin; Take 1 capsule (100 mg total) by mouth at bedtime for 15 days, THEN 2 capsules (200 mg total) at bedtime for 15 days, THEN 3 capsules (300 mg total) at bedtime.  Dispense: 225 capsule; Refill: 0  Cervical cancer screening -     Cytology - PAP    Rosalyn Gess, MD

## 2023-12-15 NOTE — Patient Instructions (Signed)

## 2023-12-15 NOTE — Assessment & Plan Note (Addendum)
 Cervical cancer screening completed at age 73 given history of normal Pap smears.  However offered continue screening after risk and benefits discussion.  Patient would like updated Pap smear this year. If normal then she would like to discontinue screening. Encouraged annual mammogram screening Colonoscopy UTD DXA UTD Labs and immunizations with her primary Encouraged safe sexual practices as indicated Encouraged healthy lifestyle practices with diet and exercise For patients over 70yo, I recommend 1200mg  calcium daily and 800IU of vitamin D daily. Normal urinary symptoms.  Encouraged Kegel exercises and recommend limiting caffeine.

## 2023-12-17 LAB — CYTOLOGY - PAP
Comment: NEGATIVE
Diagnosis: NEGATIVE
High risk HPV: NEGATIVE

## 2023-12-20 ENCOUNTER — Encounter: Payer: Self-pay | Admitting: Obstetrics and Gynecology

## 2023-12-23 ENCOUNTER — Other Ambulatory Visit: Payer: Self-pay | Admitting: Internal Medicine

## 2023-12-23 MED ORDER — OMEPRAZOLE 20 MG PO CPDR: 20.0000 mg | DELAYED_RELEASE_CAPSULE | ORAL | 1 refills | Status: DC | PRN

## 2023-12-27 ENCOUNTER — Other Ambulatory Visit: Payer: Self-pay | Admitting: *Deleted

## 2023-12-27 MED ORDER — OMEPRAZOLE 20 MG PO CPDR: 20.0000 mg | DELAYED_RELEASE_CAPSULE | Freq: Every day | ORAL | 1 refills | Status: DC | PRN

## 2023-12-30 ENCOUNTER — Telehealth: Payer: Self-pay | Admitting: *Deleted

## 2023-12-30 MED ORDER — OMEPRAZOLE 20 MG PO CPDR: 20.0000 mg | DELAYED_RELEASE_CAPSULE | Freq: Every day | ORAL | 1 refills | Status: DC | PRN

## 2023-12-30 NOTE — Telephone Encounter (Signed)
 Refill sent.

## 2024-01-10 ENCOUNTER — Telehealth: Payer: Self-pay | Admitting: Pharmacy Technician

## 2024-01-10 NOTE — Telephone Encounter (Signed)
 Pharmacy Patient Advocate Encounter  Received notification from [facility] that Prior Authorization for repatha has been APPROVED from 09/29/23 to 09/27/24   PA #/Case ID/Reference #: [id number]

## 2024-02-26 ENCOUNTER — Other Ambulatory Visit: Payer: Self-pay | Admitting: Internal Medicine

## 2024-03-04 ENCOUNTER — Other Ambulatory Visit: Payer: Self-pay | Admitting: Internal Medicine

## 2024-03-16 ENCOUNTER — Ambulatory Visit: Admitting: Obstetrics and Gynecology

## 2024-03-18 ENCOUNTER — Ambulatory Visit
Admission: EM | Admit: 2024-03-18 | Discharge: 2024-03-18 | Disposition: A | Discharge status: Home / Self Care | Attending: Family Medicine | Admitting: Family Medicine

## 2024-03-18 ENCOUNTER — Encounter: Payer: Self-pay | Admitting: *Deleted

## 2024-03-18 DIAGNOSIS — U071 COVID-19: Secondary | ICD-10-CM

## 2024-03-18 MED ORDER — NIRMATRELVIR/RITONAVIR (PAXLOVID) TABLET (RENAL DOSING): ORAL_TABLET | ORAL | 0 refills | Status: DC

## 2024-03-18 MED ORDER — BENZONATATE 100 MG PO CAPS
100.0000 mg | ORAL_CAPSULE | Freq: Three times a day (TID) | ORAL | 0 refills | Status: AC | PRN
Start: 2024-03-18 — End: ?

## 2024-03-18 NOTE — ED Triage Notes (Signed)
 Patient states she started having cough chills headache congestion bodyaches last night and tested positive for covid this morning.  Husband recently positive for covid and on Paxlovid

## 2024-03-18 NOTE — Discharge Instructions (Addendum)
 Take nirmatrelvir 150 mg -1 tablet twice daily for 5 days, plus also ritonavir 100 mg--1 tablet twice daily for 5 days.  These are antiviral medicines, meant to keep you from getting worse with a COVID-19 infection  Do not take your rosuvastatin  cholesterol medicine while taking the antiviral medication listed above  Take benzonatate 100 mg, 1 tab every 8 hours as needed for cough.

## 2024-03-18 NOTE — ED Provider Notes (Signed)
 [facility]    CSN: [id number] Arrival date & time: 03/18/24  9072      History   Chief Complaint Chief Complaint  Patient presents with   Covid Positive    HPI Karla Moore is a 73 y.o. female.   HPI Here for cough and nasal congestion and rhinorrhea.  Symptoms began last night.  Today she did a COVID test and it was positive.  She had a little nausea earlier but that got better drinking some fluids.  No vomiting or diarrhea and no shortness of breath.  Her husband began with similar symptoms on June 17 and then he began his Paxlovid yesterday after testing positive.  Last EGFR in epic was in January of this year and was mildly reduced at 54.  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    Past Surgical History:  Procedure Laterality Date   COLONOSCOPY     COLONOSCOPY WITH PROPOFOL  N/A 07/31/2019   Procedure: COLONOSCOPY WITH PROPOFOL ;  Surgeon: Avram Lupita BRAVO, MD;  Location: [facility];  Service: Endoscopy;  Laterality: N/A;   DILATION AND CURETTAGE OF UTERUS     x 2 per pt   POLYPECTOMY  07/31/2019   Procedure: POLYPECTOMY;  Surgeon: Avram Lupita BRAVO, MD;  Location: [facility];  Service: Endoscopy;;   TONSILLECTOMY     WISDOM TOOTH EXTRACTION      OB History     Gravida  4   Para  0   Term  0   Preterm  0   AB  4   Living  0      SAB  4   IAB  0    Ectopic  0   Multiple  0   Live Births  0            Home Medications    Prior to Admission medications   Medication Sig Start Date End Date Taking? Authorizing Provider  benzonatate (TESSALON) 100 MG capsule Take 1 capsule (100 mg total) by mouth 3 (three) times daily as needed for cough. 03/18/24  Yes Vonna Sharlet POUR, MD  nirmatrelvir/ritonavir, renal dosing, (PAXLOVID) 10 x 150 MG & 10 x 100MG  TABS Patient GFR is 54. Take nirmatrelvir (150 mg) one tablet twice daily for 5 days and ritonavir (100 mg) one tablet twice daily for 5 days. 03/18/24  Yes Denney Shein K, MD  Acetaminophen (TYLENOL 8 HOUR ARTHRITIS PAIN PO) Take by mouth.    [provider]  acetaminophen (TYLENOL) 500 MG tablet Take 500 mg by mouth every 6 (six) hours as needed (Cold).     [provider]  cholecalciferol (VITAMIN D ) 1000 units tablet Take 2,000 Units by mouth daily.    [provider]  Evolocumab  (REPATHA  SURECLICK) 140 MG/ML SOAJ Inject 140 mg into the skin every 14 (fourteen) days. 08/06/23   Hilty, Vinie BROCKS,  MD  ferrous sulfate (FEROSUL) 325 (65 FE) MG tablet Take 325 mg by mouth daily with breakfast. Three times a week    [provider]  gabapentin  (NEURONTIN ) 100 MG capsule Take 1 capsule (100 mg total) by mouth at bedtime for 15 days, THEN 2 capsules (200 mg total) at bedtime for 15 days, THEN 3 capsules (300 mg total) at bedtime. 12/15/23 03/14/24  Dallie Vera GAILS, MD  Magnesium 250 MG TABS Take 50 mg by mouth daily. Do not take on the weekends    [provider]  Multiple Vitamin (MULTIVITAMIN) tablet Take 1 tablet by mouth daily. Do no take on the Weekend    [provider]  omeprazole  (PRILOSEC) 20 MG capsule TAKE 1 CAPSULE BY MOUTH DAILY AS NEEDED 03/06/24   Theophilus Andrews, Tully GRADE, MD  OVER THE COUNTER MEDICATION Calcium  250 plus D3    [provider]  rosuvastatin  (CRESTOR ) 5 MG tablet TAKE 1 TABLET BY MOUTH TWICE  WEEKLY 02/28/24    Theophilus Andrews, Tully GRADE, MD  vitamin E 200 UNIT capsule Take 400 Units by mouth daily. Do not take on Weekends    [provider]    Family History Family History  Problem Relation Age of Onset   Colon cancer Mother 68       64's   Lung cancer Brother    Lung cancer Brother    Breast cancer Neg Hx    Colon polyps Neg Hx    Esophageal cancer Neg Hx    Rectal cancer Neg Hx    Stomach cancer Neg Hx     Social History Social History   Tobacco Use   Smoking status: Never   Smokeless tobacco: Never  Vaping Use   Vaping status: Never Used  Substance Use Topics   Alcohol use: Yes    Comment: socially    Drug use: Never     Allergies   Ibuprofen   Review of Systems Review of Systems   Physical Exam Triage Vital Signs ED Triage Vitals  Encounter Vitals Group     BP 03/18/24 1023 129/71     Girls Systolic BP Percentile --      Girls Diastolic BP Percentile --      Boys Systolic BP Percentile --      Boys Diastolic BP Percentile --      Pulse Rate 03/18/24 1023 98     Resp 03/18/24 1023 20     Temp 03/18/24 1023 (!) 100.9 F (38.3 C)     Temp Source 03/18/24 1023 Oral     SpO2 03/18/24 1023 95 %     Weight 03/18/24 1019 184 lb (83.5 kg)     Height 03/18/24 1019 5' 7 (1.702 m)     Head Circumference --      Peak Flow --      Pain Score 03/18/24 1018 6     Pain Loc --      Pain Education --      Exclude from Growth Chart --    No data found.  Updated Vital Signs BP 129/71 (BP Location: Right Arm)   Pulse 98   Temp (!) 100.9 F (38.3 C) (Oral)   Resp 20   Ht 5' 7 (1.702 m)   Wt 83.5 kg   SpO2 95%   BMI 28.82 kg/m   Visual Acuity Right Eye Distance:   Left Eye Distance:   Bilateral Distance:    Right Eye Near:  Left Eye Near:    Bilateral Near:     Physical Exam Vitals reviewed.  Constitutional:      General: She is not in acute distress.    Appearance: She is not ill-appearing, toxic-appearing or diaphoretic.  HENT:      Right Ear: Tympanic membrane and ear canal normal.     Left Ear: Tympanic membrane and ear canal normal.     Nose: Congestion present.     Mouth/Throat:     Mouth: Mucous membranes are moist.     Comments: There is copious clear mucus draining.  Eyes:     Extraocular Movements: Extraocular movements intact.     Conjunctiva/sclera: Conjunctivae normal.     Pupils: Pupils are equal, round, and reactive to light.    Cardiovascular:     Rate and Rhythm: Normal rate and regular rhythm.     Heart sounds: No murmur heard. Pulmonary:     Effort: Pulmonary effort is normal. No respiratory distress.     Breath sounds: No stridor. No wheezing, rhonchi or rales.   Musculoskeletal:     Cervical back: Neck supple.  Lymphadenopathy:     Cervical: No cervical adenopathy.   Skin:    Capillary Refill: Capillary refill takes less than 2 seconds.     Coloration: Skin is not jaundiced or pale.   Neurological:     General: No focal deficit present.     Mental Status: She is alert and oriented to person, place, and time.   Psychiatric:        Behavior: Behavior normal.      UC Treatments / Results  Labs (all labs ordered are listed, but only abnormal results are displayed) Labs Reviewed - No data to display  EKG   Radiology No results found.  Procedures Procedures (including critical care time)  Medications Ordered in UC Medications - No data to display  Initial Impression / Assessment and Plan / UC Course  I have reviewed the triage vital signs and the nursing notes.  Pertinent labs & imaging results that were available during my care of the patient were reviewed by me and considered in my medical decision making (see chart for details).     Paxlovid with renal dosing is sent into lessen her risk of severe COVID.  Tessalon Perles are also sent in for her symptoms.  We will ask her not to take her rosuvastatin  while she is taking the Paxlovid. Final Clinical Impressions(s)  / UC Diagnoses   Final diagnoses:  COVID-19     Discharge Instructions      Take nirmatrelvir 150 mg -1 tablet twice daily for 5 days, plus also ritonavir 100 mg--1 tablet twice daily for 5 days.  These are antiviral medicines, meant to keep you from getting worse with a COVID-19 infection  Do not take your rosuvastatin  cholesterol medicine while taking the antiviral medication listed above  Take benzonatate 100 mg, 1 tab every 8 hours as needed for cough.        ED Prescriptions     Medication Sig Dispense Auth. Provider   benzonatate (TESSALON) 100 MG capsule Take 1 capsule (100 mg total) by mouth 3 (three) times daily as needed for cough. 21 capsule Leaha Cuervo K, MD   nirmatrelvir/ritonavir, renal dosing, (PAXLOVID) 10 x 150 MG & 10 x 100MG  TABS Patient GFR is 54. Take nirmatrelvir (150 mg) one tablet twice daily for 5 days and ritonavir (100 mg) one tablet twice daily for 5 days. 20  tablet Jhoel Stieg K, MD      PDMP not reviewed this encounter.   Vonna Sharlet POUR, MD 03/18/24 [phone]

## 2024-05-15 ENCOUNTER — Other Ambulatory Visit (HOSPITAL_BASED_OUTPATIENT_CLINIC_OR_DEPARTMENT_OTHER): Payer: Self-pay | Admitting: Internal Medicine

## 2024-05-15 DIAGNOSIS — E7841 Elevated Lipoprotein(a): Secondary | ICD-10-CM

## 2024-05-15 DIAGNOSIS — E785 Hyperlipidemia, unspecified: Secondary | ICD-10-CM

## 2024-05-15 DIAGNOSIS — T466X5A Adverse effect of antihyperlipidemic and antiarteriosclerotic drugs, initial encounter: Secondary | ICD-10-CM

## 2024-05-15 DIAGNOSIS — E1169 Type 2 diabetes mellitus with other specified complication: Secondary | ICD-10-CM

## 2024-06-12 ENCOUNTER — Other Ambulatory Visit: Payer: Self-pay | Admitting: Internal Medicine

## 2024-06-12 DIAGNOSIS — Z0000 Encounter for general adult medical examination without abnormal findings: Secondary | ICD-10-CM

## 2024-06-21 ENCOUNTER — Telehealth: Payer: Self-pay | Admitting: *Deleted

## 2024-06-21 NOTE — Telephone Encounter (Signed)
 Copied from CRM [id number]. Topic: Clinical - Medical Advice >> Jun 21, 2024 11:20 AM Jasmin G wrote: Reason for CRM: Pt wanted to know if Dr. Theophilus would recommend or require for her to come in and get blood work to check her cholesterol and liver levels as she states that she usually gets seen every 6 months for this. Please message her through MyChart to let her know.

## 2024-06-21 NOTE — Telephone Encounter (Signed)
 Patient is aware.

## 2024-07-06 ENCOUNTER — Ambulatory Visit
Admission: RE | Admit: 2024-07-06 | Discharge: 2024-07-06 | Disposition: A | Discharge status: Home / Self Care | Source: Ambulatory Visit | Attending: Internal Medicine | Admitting: Internal Medicine

## 2024-07-06 DIAGNOSIS — Z1231 Encounter for screening mammogram for malignant neoplasm of breast: Secondary | ICD-10-CM | POA: Diagnosis not present

## 2024-07-06 DIAGNOSIS — Z0000 Encounter for general adult medical examination without abnormal findings: Secondary | ICD-10-CM

## 2024-07-25 DIAGNOSIS — H40013 Open angle with borderline findings, low risk, bilateral: Secondary | ICD-10-CM | POA: Diagnosis not present

## 2024-07-25 DIAGNOSIS — H02831 Dermatochalasis of right upper eyelid: Secondary | ICD-10-CM | POA: Diagnosis not present

## 2024-07-25 DIAGNOSIS — D3131 Benign neoplasm of right choroid: Secondary | ICD-10-CM | POA: Diagnosis not present

## 2024-07-25 DIAGNOSIS — H43811 Vitreous degeneration, right eye: Secondary | ICD-10-CM | POA: Diagnosis not present

## 2024-07-25 DIAGNOSIS — H02834 Dermatochalasis of left upper eyelid: Secondary | ICD-10-CM | POA: Diagnosis not present

## 2024-07-25 DIAGNOSIS — H2513 Age-related nuclear cataract, bilateral: Secondary | ICD-10-CM | POA: Diagnosis not present

## 2024-10-08 ENCOUNTER — Other Ambulatory Visit: Payer: Self-pay | Admitting: Internal Medicine

## 2024-10-19 ENCOUNTER — Encounter: Payer: Self-pay | Admitting: Family Medicine

## 2024-10-19 ENCOUNTER — Ambulatory Visit: Admitting: Family Medicine

## 2024-10-19 VITALS — BP 121/68

## 2024-10-19 DIAGNOSIS — Z0000 Encounter for general adult medical examination without abnormal findings: Secondary | ICD-10-CM | POA: Diagnosis not present

## 2024-10-19 NOTE — Progress Notes (Signed)
 " ----------------------------------------------------------------------------------------------------------------------------------------------------------------------------------------------------------------------  Because this visit was a virtual/telehealth visit, some criteria may be missing or patient reported. Any vitals not documented were not able to be obtained and vitals that have been documented are patient reported.    MEDICARE ANNUAL PREVENTIVE VISIT WITH PROVIDER: (Welcome to Medicare, initial annual wellness or annual wellness exam)  Virtual Visit via Video Note  I connected with Karla Moore on 10/19/24 by a video enabled telemedicine application and verified that I am speaking with the correct person using two identifiers.  Location patient: home Location provider:work or home office Persons participating in the virtual visit: patient, provider  Concerns and/or follow up today: detailed intake and risks/health assessment completed in flowsheets and below - please see for details.  Stable.  How often do you have a drink containing alcohol? 2 days a week How many drinks containing alcohol do you have on a typical day when you are drinking? 1 drink How often do you have six or more drinks on one occasion?never Have you ever smoked?n Quit date if applicable? na  How many packs a day do/did you smoke? na Do you use smokeless tobacco?na Do you use an illicit drugs?n Do you feel safe at home?y Last dentist visit?every 6 months  Last eye Exam and location?[facility]   See HM section in Epic for other details of completed HM.    ROS: negative for report of fevers, unintentional weight loss, vision changes, vision loss, hearing loss or change, chest pain, sob, hemoptysis, melena, hematochezia, hematuria or bleeding or bruising  Patient-completed extensive health risk assessment - reviewed and discussed with the patient: See Health Risk Assessment  completed with patient prior to the visit either above or in recent phone note. This was reviewed in detailed with the patient today and appropriate recommendations, orders and referrals were placed as needed per Summary below and patient instructions.   Review of Medical History: -PMH, PSH, Family History and current specialty and care providers reviewed and updated and listed below   Patient Care Team: Theophilus Andrews, Tully GRADE, MD as PCP - General (Internal Medicine) JINNY Prince Fell DDS PA (Dentistry) [facility] Mona Vinie BROCKS, MD as Consulting Physician (Cardiology)   Past Medical History:  Diagnosis Date   Allergy    spring    Anxiety    GERD (gastroesophageal reflux disease)    Hx of adenomatous polyp of colon 08/03/2019   Hyperlipidemia     Past Surgical History:  Procedure Laterality Date   COLONOSCOPY     COLONOSCOPY WITH PROPOFOL  N/A 07/31/2019   Procedure: COLONOSCOPY WITH PROPOFOL ;  Surgeon: Avram Lupita BRAVO, MD;  Location: [facility];  Service: Endoscopy;  Laterality: N/A;   DILATION AND CURETTAGE OF UTERUS     x 2 per pt   POLYPECTOMY  07/31/2019   Procedure: POLYPECTOMY;  Surgeon: Avram Lupita BRAVO, MD;  Location: [facility];  Service: Endoscopy;;   TONSILLECTOMY     WISDOM TOOTH EXTRACTION      Social History   Socioeconomic History   Marital status: Married    Spouse name: Not on file   Number of children: Not on file   Years of education: Not on file   Highest education level: Bachelor's degree (e.g., BA, AB, BS)  Occupational History   Not on file  Tobacco Use   Smoking status: Never   Smokeless tobacco: Never  Vaping Use   Vaping status: Never Used  Substance and Sexual Activity  Alcohol use: Yes    Comment: socially    Drug use: Never   Sexual activity: Yes  Other Topics Concern   Not on file  Social History Narrative   Not on file   Social Drivers of Health   Tobacco Use: Low Risk (10/19/2024)   Patient History     Smoking Tobacco Use: Never    Smokeless Tobacco Use: Never    Passive Exposure: Not on file  Financial Resource Strain: Low Risk (10/19/2024)   Overall Financial Resource Strain (CARDIA)    Difficulty of Paying Living Expenses: Not hard at all  Food Insecurity: No Food Insecurity (10/19/2024)   Epic    Worried About Radiation Protection Practitioner of Food in the Last Year: Never true    Ran Out of Food in the Last Year: Never true  Transportation Needs: No Transportation Needs (10/19/2024)   Epic    Lack of Transportation (Medical): No    Lack of Transportation (Non-Medical): No  Physical Activity: Sufficiently Active (10/19/2024)   Exercise Vital Sign    Days of Exercise per Week: 5 days    Minutes of Exercise per Session: 60 min  Stress: No Stress Concern Present (10/19/2024)   [employer] of Occupational Health - Occupational Stress Questionnaire    Feeling of Stress: Not at all  Social Connections: Moderately Isolated (10/19/2024)   Social Connection and Isolation Panel    Frequency of Communication with Friends and Family: More than three times a week    Frequency of Social Gatherings with Friends and Family: More than three times a week    Attends Religious Services: Patient declined    Active Member of Clubs or Organizations: No    Attends Banker Meetings: Never    Marital Status: Married  Catering Manager Violence: Not At Risk (10/19/2024)   Epic    Fear of Current or Ex-Partner: No    Emotionally Abused: No    Physically Abused: No    Sexually Abused: No  Depression (PHQ2-9): Low Risk (10/19/2024)   Depression (PHQ2-9)    PHQ-2 Score: 0  Alcohol Screen: Low Risk (10/19/2024)   Alcohol Screen    Last Alcohol Screening Score (AUDIT): 3  Housing: Low Risk (10/19/2024)   Epic    Unable to Pay for Housing in the Last Year: No    Number of Times Moved in the Last Year: 0    Homeless in the Last Year: No  Utilities: Not At Risk (10/19/2024)   Epic    Threatened with loss of  utilities: No  Health Literacy: Adequate Health Literacy (10/19/2024)   B1300 Health Literacy    Frequency of need for help with medical instructions: Never    Family History  Problem Relation Age of Onset   Colon cancer Mother 65       40's   Lung cancer Brother    Lung cancer Brother    Breast cancer Neg Hx    Colon polyps Neg Hx    Esophageal cancer Neg Hx    Rectal cancer Neg Hx    Stomach cancer Neg Hx     Medications Ordered Prior to Encounter[1]  Allergies[2]     Physical Exam Vitals requested from patient and listed below if patient had equipment and was able to obtain at home for this virtual visit: Vitals:   10/19/24 1246  BP: 121/68   Estimated body mass index is 28.82 kg/m as calculated from the following:   Height as of 03/18/24: 5'  7 (1.702 m).   Weight as of 03/18/24: 184 lb (83.5 kg).  EKG (optional): deferred due to virtual visit  GENERAL: alert, oriented, no acute distress detected, full vision exam deferred due to pandemic and/or virtual encounter  HEENT: atraumatic, conjunttiva clear, no obvious abnormalities on inspection of external nose and ears  NECK: normal movements of the head and neck  LUNGS: on inspection no signs of respiratory distress, breathing rate appears normal, no obvious gross SOB, gasping or wheezing  CV: no obvious cyanosis  MS: moves all visible extremities without noticeable abnormality  PSYCH/NEURO: pleasant and cooperative, no obvious depression or anxiety, speech and thought processing grossly intact, Cognitive function grossly intact  Flowsheet Row Office Visit from 10/14/2023 in [facility] [place]  PHQ-9 Total Score 1        10/19/2024   12:27 PM 12/15/2023    9:35 AM 10/14/2023    3:48 PM 09/30/2023   12:38 PM 09/08/2022    9:50 AM  Depression screen PHQ 2/9  Decreased Interest 0 0 0 0 0  Down, Depressed, Hopeless 0 0 0 0 0  PHQ - 2 Score 0 0 0 0 0  Altered sleeping   0  0  Tired,  decreased energy   0  2  Change in appetite   0  0  Feeling bad or failure about yourself    0  0  Trouble concentrating   1  1  Moving slowly or fidgety/restless   0  0  Suicidal thoughts   0  0  PHQ-9 Score   1   3   Difficult doing work/chores     Somewhat difficult     Data saved with a previous flowsheet row definition       09/30/2023   12:38 PM 10/14/2023    3:48 PM 12/15/2023    9:35 AM 10/19/2024   12:27 PM 10/19/2024   12:46 PM  Fall Risk  Falls in the past year? 0 0 0 0   Was there an injury with Fall? 0  0  0  0   Fall Risk Category Calculator 0 0 0 0   Patient at Risk for Falls Due to No Fall Risks  No Fall Risks No Fall Risks No Fall Risks  Fall risk Follow up Falls evaluation completed Falls evaluation completed Falls evaluation completed Falls evaluation completed      Data saved with a previous flowsheet row definition     SUMMARY AND PLAN:  Encounter for Medicare annual wellness exam  Discussed applicable health maintenance/preventive health measures and advised and referred or ordered per patient preferences: -discussed labs and foot exam due - she plans to do at next in office visit and agrees to schedule -she says she had her covid vaccine and agrees to bring copy of receipt to office Health Maintenance  Topic Date Due   Diabetic kidney evaluation - Urine ACR  Never done   HEMOGLOBIN A1C  04/12/2024   COVID-19 Vaccine (8 - 2025-26 season) 05/29/2024   Diabetic kidney evaluation - eGFR measurement  10/13/2024   FOOT EXAM  10/13/2024   OPHTHALMOLOGY EXAM  07/25/2025   Medicare Annual Wellness (AWV)  10/19/2025   Mammogram  07/06/2026   DTaP/Tdap/Td (3 - Td or Tdap) 01/04/2028   Colonoscopy  07/30/2029   Pneumococcal Vaccine: 50+ Years  Completed   Influenza Vaccine  Completed   Bone Density Scan  Completed   Hepatitis C Screening  Completed  Zoster Vaccines- Shingrix  Completed   Meningococcal B Vaccine  Aged Out      Education and counseling  on the following was provided based on the above review of health and a plan/checklist for the patient, along with additional information discussed, was provided for the patient in the patient instructions :   -Advised and counseled on a healthy lifestyle - including the importance of a healthy diet, regular physical activity, social connections and stress management. -Reviewed patient's current diet. Advised and counseled on a whole foods based healthy diet. A summary of a healthy diet was provided in the Patient Instructions.  -reviewed patient's current physical activity level and discussed exercise guidelines for adults. Discussed community resources and ideas for safe exercise at home to assist in meeting exercise guideline recommendations in a safe and healthy way.  -Advise yearly dental visits at minimum and regular eye exams   Follow up: see patient instructions     Patient Instructions  I really enjoyed getting to talk with you today! I am available on Tuesdays and Thursdays for virtual visits if you have any questions or concerns, or if I can be of any further assistance.   CHECKLIST FROM ANNUAL WELLNESS VISIT:  -Follow up (please call to schedule if not scheduled after visit):   -yearly for annual wellness visit with primary care office  Here is a list of your preventive care/health maintenance measures and the plan for each if any are due:  PLAN For any measures below that may be due:    1. Please schedule an in office visit in the next few months and get your labs and foot exam.   2. Please bring a copy of your vaccine record from the pharmacy. Thanks.   Health Maintenance  Topic Date Due   Diabetic kidney evaluation - Urine ACR  Never done   HEMOGLOBIN A1C  04/12/2024   COVID-19 Vaccine (8 - 2025-26 season) 05/29/2024   Medicare Annual Wellness (AWV)  09/29/2024   Diabetic kidney evaluation - eGFR measurement  10/13/2024   FOOT EXAM  10/13/2024   OPHTHALMOLOGY EXAM   07/25/2025   Mammogram  07/06/2026   DTaP/Tdap/Td (3 - Td or Tdap) 01/04/2028   Colonoscopy  07/30/2029   Pneumococcal Vaccine: 50+ Years  Completed   Influenza Vaccine  Completed   Bone Density Scan  Completed   Hepatitis C Screening  Completed   Zoster Vaccines- Shingrix  Completed   Meningococcal B Vaccine  Aged Out    -See a dentist at least yearly  -Get your eyes checked and then per your eye specialist's recommendations  -Other issues addressed today:   -I have included below further information regarding a healthy whole foods based diet, physical activity guidelines for adults, stress management and opportunities for social connections. I hope you find this information useful.   -----------------------------------------------------------------------------------------------------------------------------------------------------------------------------------------------------------------------------------------------------------    NUTRITION: -eat real food: lots of colorful vegetables (half the plate) and fruits -5-7 servings of vegetables and fruits per day (fresh or steamed is best), exp. 2 servings of vegetables with lunch and dinner and 2 servings of fruit per day. Berries and greens such as kale and collards are great choices.  -consume on a regular basis:  fresh fruits, fresh veggies, fish, nuts, seeds, healthy oils (such as olive oil, avocado oil), whole grains (make sure for bread/pasta/crackers/etc., that the first ingredient on label contains the word whole), legumes. -can eat small amounts of dairy and lean meat (no larger than the palm of your  hand), but avoid processed meats such as ham, bacon, lunch meat, etc. -drink water -try to avoid fast food and pre-packaged foods, processed meat, ultra processed foods/beverages (donuts, candy, etc.) -most experts advise limiting sodium to < 2300mg  per day, should limit further is any chronic conditions such as high blood  pressure, heart disease, diabetes, etc. The American Heart Association advised that < 1500mg  is is ideal -try to avoid foods/beverages that contain any ingredients with names you do not recognize  -try to avoid foods/beverages  with added sugar or sweeteners/sweets  -try to avoid sweet drinks (including diet drinks): soda, juice, Gatorade, sweet tea, power drinks, diet drinks -try to avoid white rice, white bread, pasta (unless whole grain)  EXERCISE GUIDELINES FOR ADULTS: -if you wish to increase your physical activity, do so gradually and with the approval of your doctor -STOP and seek medical care immediately if you have any chest pain, chest discomfort or trouble breathing when starting or increasing exercise  -move and stretch your body, legs, feet and arms when sitting for long periods -Physical activity guidelines for optimal health in adults: -get at least 150 minutes per week of moderate exercise (can talk, but not sing); this is about 20-30 minutes of sustained activity 5-7 days per week or two 10-15 minute episodes of sustained activity 5-7 days per week -do some muscle building/resistance training/strength training at least 2 days per week  -balance exercises 3+ days per week:   Stand somewhere where you have something sturdy to hold onto if you lose balance    1) lift up on toes, then back down, start with 5x per day and work up to 20x   2) stand and lift one leg straight out to the side so that foot is a few inches of the floor, start with 5x each side and work up to 20x each side   3) stand on one foot, start with 5 seconds each side and work up to 20 seconds on each side  If you need ideas or help with getting more active:  -Silver sneakers https://tools.silversneakers.com  -Walk with a Doc: Http://www.duncan-williams.com/  -try to include resistance (weight lifting/strength building) and balance exercises twice per week: or the following link for  ideas: http://castillo-powell.com/  buyducts.dk  STRESS MANAGEMENT: -can try meditating, or just sitting quietly with deep breathing while intentionally relaxing all parts of your body for 5 minutes daily -if you need further help with stress, anxiety or depression please follow up with your primary doctor or contact the wonderful folks at [facility]: [phone]  SOCIAL CONNECTIONS: -options in [place] if you wish to engage in more social and exercise related activities:  -Silver sneakers https://tools.silversneakers.com  -Walk with a Doc: Http://www.duncan-williams.com/  -Check out the [facility] Active Adults 50+ section on the [employer] (hiking clubs, book clubs, cards and games, chess, exercise classes, aquatic classes and much more) - see the website for details: https://www.[place].gov/departments/parks-recreation/active-adults50  -YouTube has lots of exercise videos for different ages and abilities as well  -[facility] (a variety of indoor and outdoor inperson activities for adults). [phone]. [address]  -Virtual Online Classes (a variety of topics): see seniorplanet.org or call [phone]  -consider volunteering at a school, hospice center, church, senior center or elsewhere            Chiquita JONELLE Cramp, DO      [1]  Current Outpatient Medications on File Prior to Visit  Medication Sig Dispense Refill  Acetaminophen (TYLENOL 8 HOUR ARTHRITIS PAIN PO) Take by mouth.     acetaminophen (TYLENOL) 500 MG tablet Take 500 mg by mouth every 6 (six) hours as needed (Cold).      benzonatate  (TESSALON ) 100 MG capsule Take 1 capsule (100 mg total) by mouth 3 (three) times daily as needed for cough. 21 capsule 0   cholecalciferol (VITAMIN D ) 1000 units tablet Take 2,000 Units by mouth daily.     ferrous sulfate  (FEROSUL) 325 (65 FE) MG tablet Take 325 mg by mouth daily with breakfast. Three times a week     Magnesium 250 MG TABS Take 50 mg by mouth daily. Do not take on the weekends     Multiple Vitamin (MULTIVITAMIN) tablet Take 1 tablet by mouth daily. Do no take on the Weekend     omeprazole  (PRILOSEC) 20 MG capsule TAKE 1 CAPSULE BY MOUTH DAILY AS NEEDED 100 capsule 0   OVER THE COUNTER MEDICATION Calcium  250 plus D3     REPATHA  SURECLICK 140 MG/ML SOAJ INJECT 1 PEN SUBCUTANEOUSLY  EVERY 2 WEEKS 6 mL 3   rosuvastatin  (CRESTOR ) 5 MG tablet TAKE 1 TABLET BY MOUTH TWICE  WEEKLY 29 tablet 2   vitamin E 200 UNIT capsule Take 400 Units by mouth daily. Do not take on Weekends     No current facility-administered medications on file prior to visit.  [2]  Allergies Allergen Reactions   Ibuprofen Other (See Comments)    Pt stated, It makes me feel like my heart is expanding; gave me pain  Reaction: Cardiovascular Arrest (ALLERGY/intolerance)   "

## 2024-10-19 NOTE — Patient Instructions (Signed)
 I really enjoyed getting to talk with you today! I am available on Tuesdays and Thursdays for virtual visits if you have any questions or concerns, or if I can be of any further assistance.   CHECKLIST FROM ANNUAL WELLNESS VISIT:  -Follow up (please call to schedule if not scheduled after visit):   -yearly for annual wellness visit with primary care office  Here is a list of your preventive care/health maintenance measures and the plan for each if any are due:  PLAN For any measures below that may be due:    1. Please schedule an in office visit in the next few months and get your labs and foot exam.   2. Please bring a copy of your vaccine record from the pharmacy. Thanks.   Health Maintenance  Topic Date Due   Diabetic kidney evaluation - Urine ACR  Never done   HEMOGLOBIN A1C  04/12/2024   COVID-19 Vaccine (8 - 2025-26 season) 05/29/2024   Medicare Annual Wellness (AWV)  09/29/2024   Diabetic kidney evaluation - eGFR measurement  10/13/2024   FOOT EXAM  10/13/2024   OPHTHALMOLOGY EXAM  07/25/2025   Mammogram  07/06/2026   DTaP/Tdap/Td (3 - Td or Tdap) 01/04/2028   Colonoscopy  07/30/2029   Pneumococcal Vaccine: 50+ Years  Completed   Influenza Vaccine  Completed   Bone Density Scan  Completed   Hepatitis C Screening  Completed   Zoster Vaccines- Shingrix  Completed   Meningococcal B Vaccine  Aged Out    -See a dentist at least yearly  -Get your eyes checked and then per your eye specialist's recommendations  -Other issues addressed today:   -I have included below further information regarding a healthy whole foods based diet, physical activity guidelines for adults, stress management and opportunities for social connections. I hope you find this information useful.    -----------------------------------------------------------------------------------------------------------------------------------------------------------------------------------------------------------------------------------------------------------    NUTRITION: -eat real food: lots of colorful vegetables (half the plate) and fruits -5-7 servings of vegetables and fruits per day (fresh or steamed is best), exp. 2 servings of vegetables with lunch and dinner and 2 servings of fruit per day. Berries and greens such as kale and collards are great choices.  -consume on a regular basis:  fresh fruits, fresh veggies, fish, nuts, seeds, healthy oils (such as olive oil, avocado oil), whole grains (make sure for bread/pasta/crackers/etc., that the first ingredient on label contains the word whole), legumes. -can eat small amounts of dairy and lean meat (no larger than the palm of your hand), but avoid processed meats such as ham, bacon, lunch meat, etc. -drink water -try to avoid fast food and pre-packaged foods, processed meat, ultra processed foods/beverages (donuts, candy, etc.) -most experts advise limiting sodium to < 2300mg  per day, should limit further is any chronic conditions such as high blood pressure, heart disease, diabetes, etc. The American Heart Association advised that < 1500mg  is is ideal -try to avoid foods/beverages that contain any ingredients with names you do not recognize  -try to avoid foods/beverages  with added sugar or sweeteners/sweets  -try to avoid sweet drinks (including diet drinks): soda, juice, Gatorade, sweet tea, power drinks, diet drinks -try to avoid white rice, white bread, pasta (unless whole grain)  EXERCISE GUIDELINES FOR ADULTS: -if you wish to increase your physical activity, do so gradually and with the approval of your doctor -STOP and seek medical care immediately if you have any chest pain, chest discomfort or trouble breathing when starting or  increasing exercise  -  move and stretch your body, legs, feet and arms when sitting for long periods -Physical activity guidelines for optimal health in adults: -get at least 150 minutes per week of moderate exercise (can talk, but not sing); this is about 20-30 minutes of sustained activity 5-7 days per week or two 10-15 minute episodes of sustained activity 5-7 days per week -do some muscle building/resistance training/strength training at least 2 days per week  -balance exercises 3+ days per week:   Stand somewhere where you have something sturdy to hold onto if you lose balance    1) lift up on toes, then back down, start with 5x per day and work up to 20x   2) stand and lift one leg straight out to the side so that foot is a few inches of the floor, start with 5x each side and work up to 20x each side   3) stand on one foot, start with 5 seconds each side and work up to 20 seconds on each side  If you need ideas or help with getting more active:  -Silver sneakers https://tools.silversneakers.com  -Walk with a Doc: Http://www.duncan-williams.com/  -try to include resistance (weight lifting/strength building) and balance exercises twice per week: or the following link for ideas: http://castillo-powell.com/  buyducts.dk  STRESS MANAGEMENT: -can try meditating, or just sitting quietly with deep breathing while intentionally relaxing all parts of your body for 5 minutes daily -if you need further help with stress, anxiety or depression please follow up with your primary doctor or contact the wonderful folks at [facility]: [phone]  SOCIAL CONNECTIONS: -options in [place] if you wish to engage in more social and exercise related activities:  -Silver sneakers https://tools.silversneakers.com  -Walk with a Doc: Http://www.duncan-williams.com/  -Check out the [facility] Active Adults 50+  section on the [place] [employer] (hiking clubs, book clubs, cards and games, chess, exercise classes, aquatic classes and much more) - see the website for details: https://www.[place].gov/departments/parks-recreation/active-adults50  -YouTube has lots of exercise videos for different ages and abilities as well  -[facility] (a variety of indoor and outdoor inperson activities for adults). [phone]. [address]  -Virtual Online Classes (a variety of topics): see seniorplanet.org or call [phone]  -consider volunteering at a school, hospice center, church, senior center or elsewhere

## 2024-10-26 ENCOUNTER — Other Ambulatory Visit: Payer: Self-pay | Admitting: *Deleted

## 2024-10-26 DIAGNOSIS — E785 Hyperlipidemia, unspecified: Secondary | ICD-10-CM

## 2024-10-27 ENCOUNTER — Telehealth: Payer: Self-pay

## 2024-10-27 DIAGNOSIS — R5383 Other fatigue: Secondary | ICD-10-CM

## 2024-10-27 DIAGNOSIS — E1169 Type 2 diabetes mellitus with other specified complication: Secondary | ICD-10-CM

## 2024-10-27 DIAGNOSIS — Z0000 Encounter for general adult medical examination without abnormal findings: Secondary | ICD-10-CM

## 2024-10-27 DIAGNOSIS — E782 Mixed hyperlipidemia: Secondary | ICD-10-CM

## 2024-10-27 NOTE — Telephone Encounter (Signed)
 Copied from CRM [id number]. Topic: Clinical - Request for Lab/Test Order >> Oct 27, 2024 11:47 AM Karla Moore wrote: Reason for CRM: Patient is requesting labs prior to physical. Advised we will call to schedule once orders are placed.

## 2024-10-31 NOTE — Telephone Encounter (Signed)
Lab appointment and orders placed. 

## 2024-10-31 NOTE — Telephone Encounter (Signed)
 Labs ordered.

## 2024-10-31 NOTE — Addendum Note (Signed)
 Addended by: KATHRYNE MILLMAN B on: 10/31/2024 04:03 PM   Modules accepted: Orders

## 2024-12-13 ENCOUNTER — Encounter (HOSPITAL_BASED_OUTPATIENT_CLINIC_OR_DEPARTMENT_OTHER): Admitting: Nurse Practitioner

## 2025-01-24 ENCOUNTER — Other Ambulatory Visit

## 2025-01-31 ENCOUNTER — Encounter: Admitting: Internal Medicine
